# Patient Record
Sex: Female | Born: 1935 | Race: White | Hispanic: No | Smoking: Former smoker
Health system: Southern US, Community
[De-identification: ages and names within clinical notes are randomized; demographics above are authoritative.]

## PROBLEM LIST (undated history)

## (undated) DIAGNOSIS — R262 Difficulty in walking, not elsewhere classified: Secondary | ICD-10-CM

## (undated) DIAGNOSIS — R293 Abnormal posture: Secondary | ICD-10-CM

## (undated) DIAGNOSIS — M545 Low back pain, unspecified: Secondary | ICD-10-CM

## (undated) DIAGNOSIS — R279 Unspecified lack of coordination: Secondary | ICD-10-CM

## (undated) DIAGNOSIS — R42 Dizziness and giddiness: Secondary | ICD-10-CM

## (undated) DIAGNOSIS — B356 Tinea cruris: Secondary | ICD-10-CM

## (undated) DIAGNOSIS — M6281 Muscle weakness (generalized): Secondary | ICD-10-CM

## (undated) DIAGNOSIS — M129 Arthropathy, unspecified: Secondary | ICD-10-CM

## (undated) DIAGNOSIS — R489 Unspecified symbolic dysfunctions: Secondary | ICD-10-CM

## (undated) DIAGNOSIS — I10 Essential (primary) hypertension: Secondary | ICD-10-CM

---

## 2006-07-14 ENCOUNTER — Ambulatory Visit: Payer: Self-pay | Admitting: Orthopedic Surgery

## 2006-07-24 ENCOUNTER — Ambulatory Visit: Payer: Self-pay | Admitting: Orthopedic Surgery

## 2006-09-24 ENCOUNTER — Ambulatory Visit: Payer: Self-pay | Admitting: Orthopedic Surgery

## 2006-10-08 ENCOUNTER — Encounter: Admission: RE | Admit: 2006-10-08 | Discharge: 2006-10-08 | Payer: Self-pay | Admitting: Orthopedic Surgery

## 2006-10-29 ENCOUNTER — Encounter: Admission: RE | Admit: 2006-10-29 | Discharge: 2006-10-29 | Payer: Self-pay | Admitting: Orthopedic Surgery

## 2006-11-20 ENCOUNTER — Encounter: Admission: RE | Admit: 2006-11-20 | Discharge: 2006-11-20 | Payer: Self-pay | Admitting: Orthopedic Surgery

## 2006-12-26 DIAGNOSIS — Z8679 Personal history of other diseases of the circulatory system: Secondary | ICD-10-CM | POA: Insufficient documentation

## 2006-12-29 ENCOUNTER — Ambulatory Visit: Payer: Self-pay | Admitting: Orthopedic Surgery

## 2006-12-29 DIAGNOSIS — M1710 Unilateral primary osteoarthritis, unspecified knee: Secondary | ICD-10-CM

## 2006-12-29 DIAGNOSIS — M5137 Other intervertebral disc degeneration, lumbosacral region: Secondary | ICD-10-CM

## 2007-04-06 ENCOUNTER — Ambulatory Visit: Payer: Self-pay | Admitting: Orthopedic Surgery

## 2007-04-13 ENCOUNTER — Telehealth: Payer: Self-pay | Admitting: Orthopedic Surgery

## 2007-04-22 ENCOUNTER — Encounter: Payer: Self-pay | Admitting: Orthopedic Surgery

## 2007-05-25 ENCOUNTER — Encounter: Payer: Self-pay | Admitting: Orthopedic Surgery

## 2007-07-01 ENCOUNTER — Encounter: Payer: Self-pay | Admitting: Orthopedic Surgery

## 2007-10-19 ENCOUNTER — Telehealth: Payer: Self-pay | Admitting: Orthopedic Surgery

## 2007-11-02 ENCOUNTER — Ambulatory Visit: Payer: Self-pay | Admitting: Orthopedic Surgery

## 2007-11-02 DIAGNOSIS — M25469 Effusion, unspecified knee: Secondary | ICD-10-CM

## 2007-11-26 ENCOUNTER — Encounter: Payer: Self-pay | Admitting: Orthopedic Surgery

## 2007-12-17 ENCOUNTER — Encounter: Payer: Self-pay | Admitting: Orthopedic Surgery

## 2008-03-03 ENCOUNTER — Encounter: Payer: Self-pay | Admitting: Orthopedic Surgery

## 2008-06-02 ENCOUNTER — Encounter: Payer: Self-pay | Admitting: Orthopedic Surgery

## 2008-08-11 ENCOUNTER — Encounter: Payer: Self-pay | Admitting: Orthopedic Surgery

## 2009-05-09 ENCOUNTER — Encounter: Payer: Self-pay | Admitting: Internal Medicine

## 2009-05-10 ENCOUNTER — Ambulatory Visit (HOSPITAL_COMMUNITY): Admission: RE | Admit: 2009-05-10 | Discharge: 2009-05-10 | Payer: Self-pay | Admitting: Internal Medicine

## 2009-05-22 ENCOUNTER — Encounter: Payer: Self-pay | Admitting: Interventional Radiology

## 2009-05-30 ENCOUNTER — Ambulatory Visit: Payer: Self-pay | Admitting: Orthopedic Surgery

## 2010-02-26 ENCOUNTER — Telehealth: Payer: Self-pay | Admitting: Orthopedic Surgery

## 2010-04-08 ENCOUNTER — Encounter: Payer: Self-pay | Admitting: Interventional Radiology

## 2010-04-17 NOTE — Assessment & Plan Note (Signed)
Summary: BILAT KNEE PAIN/NEEDS XRAYS/MEDICARE,MUT OM/CAF   Visit Type:  Follow-up  CC:  bilateral knee pain.  History of Present Illness: 77 year old female status post T12 vertebroplasty in February 20 11th presents now for bilateral knee pain previously seen for this in the past given injections did well.  Complaint anterior knee pain which is moderate to severe, associated with weakness, swelling.  11/02/07 last visit here for knees received injections, injections helped.  Uses walker all the time.  Meds: Tylenol does not help, Ditropan, Metoprolol, Calcium, Lisinopril, Relafen two times a day.occasionally  Problems Prior to Update: 1)  Joint Effusion, Knee  (ICD-719.06) 2)  Disc Disease, Lumbosacral Spine  (ICD-722.52) 3)  Osteoarthritis, Lower Leg  (ICD-715.16) 4)  High Blood Pressure  (ICD-V12.50)  Allergies (verified): No Known Drug Allergies  Past History:  Past Medical History: Last updated: 12/26/2006 High Blood Pressure Reflux Arthritis Fibromyalgia  Past Surgical History: Last updated: 12/26/2006 Cholecystectomy Rotator cuff repair Hernia  Family History: Last updated: 12/29/2006 stroke heart   Review of Systems Musculoskeletal:  Complains of joint pain, swelling, and stiffness; denies instability, redness, heat, and muscle pain.  Physical Exam  Additional Exam:  Medium to large size shortened stature with no deformities, well groomed.  Normal pulses in the feet.  Skin intact normal RIGHT and LEFT knee.  Awake alert oriented x3 mood affect normal  Ambulates with a walker.  Inspection reveals no joint effusion antral medial joint line tenderness mild range of motion 120 near full extension in both knees strengths normal knees are stable meniscus signs are negative     Impression & Recommendations:  Problem # 1:  OSTEOARTHRITIS, LOWER LEG (ICD-715.16) Assessment Deteriorated  bilateral knee injections  X-rays both knees.  Again as noted  previously there only mild subtle changes of arthritis in these knees.  Her x-rays do not mattress symptoms.  She has a history of lumbar disc disease and I suspect that she is having some referred pain as well.  Again her x-rays look relatively normal for the age of 48.   Verbal consent was obtained. The knee was prepped with alcohol and ethyl chloride. 1 cc of depomedrol 40mg /cc and 4 cc of lidocaine 1% was injected. there were no complications.  Procedure repeated LEFT knee first on the RIGHT knee  Orders: Est. Patient Level IV (42353) Depo- Medrol 40mg  (J1030) Joint Aspirate / Injection, Large (20610)  Medications Added to Medication List This Visit: 1)  Nabumetone 500 Mg Tabs (Nabumetone) .Marland Kitchen.. 1 by mouth two times a day  Patient Instructions: 1)  You have received an injection of cortisone today. You may experience increased pain at the injection site. Apply ice pack to the area for 20 minutes every 2 hours and take 2 xtra strength tylenol every 8 hours. This increased pain will usually resolve in 24 hours. The injection will take effect in 3-10 days.  2)  f/u  as needed 3)  take relafen [nabumetone two times a day] Prescriptions: NABUMETONE 500 MG TABS (NABUMETONE) 1 by mouth two times a day  #60 x 2   Entered and Authorized by:   Fuller Canada MD   Signed by:   Fuller Canada MD on 05/30/2009   Method used:   Print then Give to Patient   RxID:   [id number]

## 2010-04-19 NOTE — Progress Notes (Signed)
Summary: call from patient request appointment for hip  Phone Note Call from Patient   Caller: Patient Summary of Call: Patient requests appointment for RT hip pain, believes it may be bursitis.  Has had back surgery @ [facility] although patient said "not her back".  Her primary care physician is Dr Sherril Croon, she states" has not seen him in a while."  Schedule here or other recommendation? Initial call taken by: Cammie Sickle,  February 26, 2010 2:17 PM  Follow-up for Phone Call        where is the pain   groin ? hip problem  anterior thigh ? - hip problem   side of thigh? hip problem   buttocks ? back problem  back ? back probem  Follow-up by: Fuller Canada MD,  February 26, 2010 2:36 PM  Additional Follow-up for Phone Call Additional follow up Details #1::        Advised patient, per re-review with Dr Romeo Apple, to see primary care (Dr Sherril Croon) for workup and referral here if Dr Sherril Croon wishes to refer. Additional Follow-up by: Cammie Sickle,  February 27, 2010 6:19 PM

## 2010-06-07 LAB — PROTIME-INR: INR: 0.98 (ref 0.00–1.49)

## 2010-06-07 LAB — BASIC METABOLIC PANEL
BUN: 18 mg/dL (ref 6–23)
CO2: 24 mEq/L (ref 19–32)
Calcium: 9.6 mg/dL (ref 8.4–10.5)
Chloride: 107 mEq/L (ref 96–112)
Creatinine, Ser: 0.83 mg/dL (ref 0.4–1.2)
GFR calc non Af Amer: >60 mL/min (ref >60)

## 2010-06-07 LAB — CBC
HCT: 41.7 % (ref 36.0–46.0)
MCHC: 34.2 g/dL (ref 30.0–36.0)
RBC: 4.65 MIL/uL (ref 3.87–5.11)

## 2012-09-23 ENCOUNTER — Encounter (HOSPITAL_COMMUNITY): Payer: Self-pay | Admitting: Emergency Medicine

## 2012-09-23 ENCOUNTER — Emergency Department (HOSPITAL_COMMUNITY)
Admission: EM | Admit: 2012-09-23 | Discharge: 2012-09-23 | Disposition: A | Discharge status: Home / Self Care | Payer: Medicare Other | Attending: Emergency Medicine | Admitting: Emergency Medicine

## 2012-09-23 ENCOUNTER — Emergency Department (HOSPITAL_COMMUNITY): Payer: Medicare Other

## 2012-09-23 DIAGNOSIS — R05 Cough: Secondary | ICD-10-CM | POA: Insufficient documentation

## 2012-09-23 DIAGNOSIS — M129 Arthropathy, unspecified: Secondary | ICD-10-CM | POA: Insufficient documentation

## 2012-09-23 DIAGNOSIS — R29810 Facial weakness: Secondary | ICD-10-CM | POA: Insufficient documentation

## 2012-09-23 DIAGNOSIS — F0390 Unspecified dementia without behavioral disturbance: Secondary | ICD-10-CM | POA: Insufficient documentation

## 2012-09-23 DIAGNOSIS — Z87891 Personal history of nicotine dependence: Secondary | ICD-10-CM | POA: Insufficient documentation

## 2012-09-23 DIAGNOSIS — Z8669 Personal history of other diseases of the nervous system and sense organs: Secondary | ICD-10-CM | POA: Insufficient documentation

## 2012-09-23 DIAGNOSIS — I10 Essential (primary) hypertension: Secondary | ICD-10-CM | POA: Insufficient documentation

## 2012-09-23 DIAGNOSIS — Z8619 Personal history of other infectious and parasitic diseases: Secondary | ICD-10-CM | POA: Insufficient documentation

## 2012-09-23 DIAGNOSIS — R5381 Other malaise: Secondary | ICD-10-CM | POA: Insufficient documentation

## 2012-09-23 DIAGNOSIS — R5383 Other fatigue: Secondary | ICD-10-CM | POA: Insufficient documentation

## 2012-09-23 DIAGNOSIS — Z8739 Personal history of other diseases of the musculoskeletal system and connective tissue: Secondary | ICD-10-CM | POA: Insufficient documentation

## 2012-09-23 DIAGNOSIS — R059 Cough, unspecified: Secondary | ICD-10-CM | POA: Insufficient documentation

## 2012-09-23 DIAGNOSIS — R0682 Tachypnea, not elsewhere classified: Secondary | ICD-10-CM | POA: Insufficient documentation

## 2012-09-23 DIAGNOSIS — R4182 Altered mental status, unspecified: Secondary | ICD-10-CM | POA: Insufficient documentation

## 2012-09-23 DIAGNOSIS — Z7982 Long term (current) use of aspirin: Secondary | ICD-10-CM | POA: Insufficient documentation

## 2012-09-23 DIAGNOSIS — Z79899 Other long term (current) drug therapy: Secondary | ICD-10-CM | POA: Insufficient documentation

## 2012-09-23 HISTORY — DX: Abnormal posture: R29.3

## 2012-09-23 HISTORY — DX: Arthropathy, unspecified: M12.9

## 2012-09-23 HISTORY — DX: Muscle weakness (generalized): M62.81

## 2012-09-23 HISTORY — DX: Essential (primary) hypertension: I10

## 2012-09-23 HISTORY — DX: Unspecified lack of coordination: R27.9

## 2012-09-23 HISTORY — DX: Tinea cruris: B35.6

## 2012-09-23 HISTORY — DX: Dizziness and giddiness: R42

## 2012-09-23 HISTORY — DX: Unspecified symbolic dysfunctions: R48.9

## 2012-09-23 HISTORY — DX: Low back pain, unspecified: M54.50

## 2012-09-23 HISTORY — DX: Difficulty in walking, not elsewhere classified: R26.2

## 2012-09-23 HISTORY — DX: Low back pain: M54.5

## 2012-09-23 LAB — CBC WITH DIFFERENTIAL/PLATELET
Basophils Absolute: 0 10*3/uL (ref 0.0–0.1)
Basophils Relative: 0 % (ref 0–1)
Eosinophils Absolute: 0 10*3/uL (ref 0.0–0.7)
Eosinophils Relative: 0 % (ref 0–5)
HCT: 36.2 % (ref 36.0–46.0)
Hemoglobin: 12.4 g/dL (ref 12.0–15.0)
MCHC: 34.3 g/dL (ref 30.0–36.0)
MCV: 88.1 fL (ref 78.0–100.0)
Monocytes Absolute: 1.7 10*3/uL — ABNORMAL HIGH (ref 0.1–1.0)
RDW: 13.1 % (ref 11.5–15.5)
WBC: 15.3 10*3/uL — ABNORMAL HIGH (ref 4.0–10.5)

## 2012-09-23 LAB — BASIC METABOLIC PANEL
BUN: 16 mg/dL (ref 6–23)
Calcium: 9.5 mg/dL (ref 8.4–10.5)
Chloride: 101 mEq/L (ref 96–112)
Glucose, Bld: 138 mg/dL — ABNORMAL HIGH (ref 70–99)
Sodium: 136 mEq/L (ref 135–145)

## 2012-09-23 NOTE — ED Provider Notes (Signed)
History  This chart was scribed for Donnetta Hutching, MD by Ardelia Mems, ED Scribe. This patient was seen in room APA05/[facility]05 and the patient's care was started at 8:33 PM.  CSN: [id number]  Arrival date & time 09/23/12  1957   Chief Complaint  Patient presents with  . Altered Mental Status  . Facial Droop    The history is provided by the nursing home. No language interpreter was used.     Level 5 Caveat (Dementia)  HPI Comments: Christina Bartlett is a 79 y.o. Female with a hx of HTN and generalized muscle weakness who presents to the Emergency Department complaining of altered mental status and left-sided facial droop. Pt is not a good historian and the hx is being provided by a nurse. All that the pt says is that "it all began as a sore throat 4-5 weeks ago. The pt's nurse from [facility] took over providing the pt's history and states that pt has had a left-sided facial droop, weakness and altered mental status noticed earlier today. Nurse states that pt is normally able to walk with assistance, but now is unable. Pt also has a non-productive cough and a UTI for which she is being treated for with antibiotics. Pt is a former smoker.   PCP- None   Past Medical History  Diagnosis Date  . Muscle weakness (generalized)   . Hypertension   . Symbolic dysfunction   . Arthropathy   . Lumbago   . Dermatophytosis of groin and perianal area   . Dizziness and giddiness   . Abnormal posture   . Difficulty in walking   . Lack of coordination    History reviewed. No pertinent past surgical history.  No family history on file.  History  Substance Use Topics  . Smoking status: Former Games developer  . Smokeless tobacco: Not on file  . Alcohol Use: No   OB History   Grav Para Term Preterm Abortions TAB SAB Ect Mult Living                 Review of Systems  Unable to perform ROS: Dementia   Allergies  Metoclopramide; Omeprazole; Pantoprazole; Penicillins; Prednisone; and Sulfa  antibiotics  Home Medications   Current Outpatient Rx  Name  Route  Sig  Dispense  Refill  . aspirin EC 81 MG tablet   Oral   Take 81 mg by mouth daily.         Marland Kitchen docusate sodium (COLACE) 100 MG capsule   Oral   Take 100 mg by mouth 2 (two) times daily.         Marland Kitchen HYDROcodone-acetaminophen (NORCO/VICODIN) 5-325 MG per tablet   Oral   Take 1 tablet by mouth every 4 (four) hours as needed for pain.         Marland Kitchen lisinopril (PRINIVIL,ZESTRIL) 10 MG tablet   Oral   Take 10-20 mg by mouth 2 (two) times daily. Takes 20 mg in the morning and 10 mg in the evening         . metaxalone (SKELAXIN) 800 MG tablet   Oral   Take 800 mg by mouth 3 (three) times daily as needed for pain.          Triage Vitals: BP 135/72  Pulse 97  Temp(Src) 98.7 F (37.1 C) (Oral)  Resp 18  SpO2 93%  Physical Exam  Nursing note and vitals reviewed. Constitutional: She is oriented to person, place, and time. She appears well-developed and well-nourished.  HENT:  Head: Normocephalic and atraumatic.  Eyes: Conjunctivae and EOM are normal. Pupils are equal, round, and reactive to light.  Neck: Normal range of motion. Neck supple.  Cardiovascular: Normal rate, regular rhythm and normal heart sounds.   Pulmonary/Chest: Effort normal and breath sounds normal.  Slightly tachypneic.  Abdominal: Soft. Bowel sounds are normal.  Musculoskeletal: Normal range of motion.  Neurological: She is alert and oriented to person, place, and time.  Able to move all her extremities but not vigorously.  Skin: Skin is warm and dry.  Psychiatric: She has a normal mood and affect.    ED Course  Procedures (including critical care time)  DIAGNOSTIC STUDIES: Oxygen Saturation is 93% on RA, normal by my interpretation.    COORDINATION OF CARE: 8:53 PM- Pt's nurse from [facility] advised of plan for diagnostic lab work and radiology and she agrees.   Labs Reviewed  CBC WITH DIFFERENTIAL - Abnormal; Notable for the  following:    WBC 15.3 (*)    Neutrophils Relative % 80 (*)    Neutro Abs 12.2 (*)    Lymphocytes Relative 9 (*)    Monocytes Absolute 1.7 (*)    All other components within normal limits  BASIC METABOLIC PANEL - Abnormal; Notable for the following:    Potassium 3.4 (*)    Glucose, Bld 138 (*)    GFR calc non Af Amer 82 (*)    All other components within normal limits  URINALYSIS, ROUTINE W REFLEX MICROSCOPIC   Ct Head Wo Contrast  09/23/2012   *RADIOLOGY REPORT*  Clinical Data: Altered mental status with left-sided facial droop and weakness.  Symptoms noted earlier today.  CT HEAD WITHOUT CONTRAST  Technique:  Contiguous axial images were obtained from the base of the skull through the vertex without contrast.  Comparison: None.  Findings: Technically limited study due to streak artifact and motion artifact in the skull base.  Diffuse cerebral atrophy. Ventricular dilatation likely due to central atrophy.  Low attenuation changes throughout the deep white matter consistent with small vessel ischemia.  No mass effect or midline shift.  No abnormal extra-axial fluid collections.  Gray-white matter junctions are distinct.  Basal cisterns are not effaced.  No evidence of acute intracranial hemorrhage.  No depressed skull fractures.  Visualized portions of the orbits and paranasal sinuses are not opacified.  IMPRESSION: No acute intracranial abnormalities demonstrated.  Prominent atrophy and small vessel ischemic changes.   Original Report Authenticated By: Burman Nieves, M.D.   Dg Chest Portable 1 View  09/23/2012   *RADIOLOGY REPORT*  Clinical Data: Altered mental status.  Facial droop tonight.  PORTABLE CHEST - 1 VIEW  Comparison: None.  Findings: Slightly shallow inspiration.  Heart size and pulmonary vascularity are normal.  There is diffuse interstitial change in the lungs which could represent chronic bronchitic change. Interstitial pneumonitis is not excluded.  Focal increased density in the  left midlung may represent superimposed pneumonia or atelectasis.  No blunting of costophrenic angles.  No pneumothorax. Calcified and tortuous aorta.  IMPRESSION: Diffuse interstitial changes suggesting fibrosis, interstitial pneumonitis, versus chronic bronchitic change.  Superimposed focal consolidation or atelectasis in the left mid lung.   Original Report Authenticated By: Burman Nieves, M.D.    No diagnosis found.  MDM  CT head shows no acute findings.  Chest x-ray shows diffuse interstitial changes but no pneumonia.  Patient is immobile nursing home. No reason to admit to hospital for acute care  I personally performed the services described in this documentation, which was scribed in my presence. The recorded information has been reviewed and is accurate.    Donnetta Hutching, MD 09/23/12 2320

## 2012-09-23 NOTE — ED Notes (Signed)
Pt is a resident of [facility].  Per staff at facility, pt has had some altered mental status and has a droop to the left side of her face since earlier today (unknown time)   Pt is awake, alert, mildly confused. Pt is apparently being treated for uti and also has a productive cough

## 2012-10-20 ENCOUNTER — Other Ambulatory Visit (HOSPITAL_COMMUNITY): Payer: Self-pay | Admitting: Internal Medicine

## 2012-10-20 DIAGNOSIS — M542 Cervicalgia: Secondary | ICD-10-CM

## 2012-10-20 DIAGNOSIS — M810 Age-related osteoporosis without current pathological fracture: Secondary | ICD-10-CM

## 2012-10-21 ENCOUNTER — Ambulatory Visit (HOSPITAL_COMMUNITY)
Admission: RE | Admit: 2012-10-21 | Discharge: 2012-10-21 | Disposition: A | Discharge status: Home / Self Care | Payer: Medicare Other | Source: Ambulatory Visit | Attending: Internal Medicine | Admitting: Internal Medicine

## 2012-10-21 ENCOUNTER — Other Ambulatory Visit (HOSPITAL_COMMUNITY): Payer: Self-pay | Admitting: Internal Medicine

## 2012-10-21 DIAGNOSIS — M546 Pain in thoracic spine: Secondary | ICD-10-CM | POA: Insufficient documentation

## 2012-10-21 DIAGNOSIS — M47812 Spondylosis without myelopathy or radiculopathy, cervical region: Secondary | ICD-10-CM | POA: Insufficient documentation

## 2012-10-21 DIAGNOSIS — M4804 Spinal stenosis, thoracic region: Secondary | ICD-10-CM | POA: Insufficient documentation

## 2012-10-21 DIAGNOSIS — M8448XA Pathological fracture, other site, initial encounter for fracture: Secondary | ICD-10-CM | POA: Insufficient documentation

## 2012-10-21 DIAGNOSIS — M542 Cervicalgia: Secondary | ICD-10-CM | POA: Insufficient documentation

## 2012-10-22 ENCOUNTER — Ambulatory Visit (HOSPITAL_COMMUNITY)
Admission: RE | Admit: 2012-10-22 | Discharge: 2012-10-22 | Disposition: A | Discharge status: Home / Self Care | Payer: Medicare Other | Source: Ambulatory Visit | Attending: Internal Medicine | Admitting: Internal Medicine

## 2012-10-22 DIAGNOSIS — M810 Age-related osteoporosis without current pathological fracture: Secondary | ICD-10-CM | POA: Insufficient documentation

## 2014-11-02 ENCOUNTER — Encounter (HOSPITAL_COMMUNITY): Payer: Self-pay

## 2014-11-02 ENCOUNTER — Emergency Department (HOSPITAL_COMMUNITY): Payer: Medicare Other

## 2014-11-02 ENCOUNTER — Emergency Department (HOSPITAL_COMMUNITY)
Admission: EM | Admit: 2014-11-02 | Discharge: 2014-11-03 | Disposition: A | Discharge status: Other Acute Inpatient Hospital | Payer: Medicare Other | Attending: Physician Assistant | Admitting: Physician Assistant

## 2014-11-02 DIAGNOSIS — S098XXA Other specified injuries of head, initial encounter: Secondary | ICD-10-CM | POA: Diagnosis present

## 2014-11-02 DIAGNOSIS — Y939 Activity, unspecified: Secondary | ICD-10-CM | POA: Insufficient documentation

## 2014-11-02 DIAGNOSIS — W050XXA Fall from non-moving wheelchair, initial encounter: Secondary | ICD-10-CM | POA: Diagnosis not present

## 2014-11-02 DIAGNOSIS — S12100A Unspecified displaced fracture of second cervical vertebra, initial encounter for closed fracture: Secondary | ICD-10-CM

## 2014-11-02 DIAGNOSIS — Y999 Unspecified external cause status: Secondary | ICD-10-CM | POA: Insufficient documentation

## 2014-11-02 DIAGNOSIS — S12112A Nondisplaced Type II dens fracture, initial encounter for closed fracture: Secondary | ICD-10-CM | POA: Diagnosis not present

## 2014-11-02 DIAGNOSIS — I10 Essential (primary) hypertension: Secondary | ICD-10-CM | POA: Insufficient documentation

## 2014-11-02 DIAGNOSIS — Y92129 Unspecified place in nursing home as the place of occurrence of the external cause: Secondary | ICD-10-CM | POA: Diagnosis not present

## 2014-11-02 DIAGNOSIS — W19XXXA Unspecified fall, initial encounter: Secondary | ICD-10-CM

## 2014-11-02 MED ORDER — ACETAMINOPHEN 500 MG PO TABS
1000.0000 mg | ORAL_TABLET | Freq: Once | ORAL | Status: AC
  Administered 2014-11-02: 1000 mg via ORAL
  Filled 2014-11-02: qty 2

## 2014-11-02 NOTE — ED Notes (Signed)
Pt states she fell out of her wheelchair onto her head. Pt has hematoma and abrasion to left side of forehead. Pt denies an LOC. Per EMS, pt was complaining of pain on the right side of her neck

## 2014-11-02 NOTE — ED Notes (Signed)
Pt. Reports falling from wheelchair. Pt. C/o back pain. Pt. With abrasion to forehead. Pt. Alert, no distress noted.

## 2014-11-02 NOTE — ED Provider Notes (Signed)
CSN: [id number]     Arrival date & time 11/02/14  1749 History   First MD Initiated Contact with Patient 11/02/14 1754     Chief Complaint  Patient presents with  . Fall     (Consider location/radiation/quality/duration/timing/severity/associated sxs/prior Treatment) Patient is a 81 y.o. female presenting with fall. The history is provided by the patient.  Fall This is a new problem. The current episode started less than 1 hour ago. The problem occurs constantly. The problem has not changed since onset.Pertinent negatives include no chest pain, no headaches and no shortness of breath. Nothing aggravates the symptoms. Nothing relieves the symptoms. She has tried nothing for the symptoms. The treatment provided no relief.   81 yo F with a chief complaint of a fall. Patient slid out of a wheelchair and landed on her head. Patient unsure of the risks of events. Patient with significant dementia making it difficult for history. Per the nursing home fell out of the wheelchair hit her head unknown loss of consciousness. Complaining of right-sided neck pain. Patient with flexed upper extremities.  lvl V caveat for dementia.     Past Medical History  Diagnosis Date  . Muscle weakness (generalized)   . Hypertension   . Symbolic dysfunction   . Arthropathy   . Lumbago   . Dermatophytosis of groin and perianal area   . Dizziness and giddiness   . Abnormal posture   . Difficulty in walking   . Lack of coordination    History reviewed. No pertinent past surgical history. No family history on file. Social History  Substance Use Topics  . Smoking status: Former Games developer  . Smokeless tobacco: None  . Alcohol Use: No   OB History    No data available     Review of Systems  Constitutional: Negative for fever and chills.  HENT: Negative for congestion and rhinorrhea.   Eyes: Negative for redness and visual disturbance.  Respiratory: Negative for shortness of breath and wheezing.    Cardiovascular: Negative for chest pain and palpitations.  Gastrointestinal: Negative for nausea and vomiting.  Genitourinary: Negative for dysuria and urgency.  Musculoskeletal: Positive for myalgias and arthralgias.  Skin: Negative for pallor and wound.  Neurological: Negative for dizziness and headaches.      Allergies  Metoclopramide; Omeprazole; Pantoprazole; Penicillins; Prednisone; and Sulfa antibiotics  Home Medications   Prior to Admission medications   Medication Sig Start Date End Date Taking? Authorizing Provider  acetaminophen (TYLENOL) 500 MG tablet Take 500 mg by mouth every 6 (six) hours as needed for mild pain or moderate pain.   Yes Historical Provider, MD  aspirin EC 81 MG tablet Take 81 mg by mouth daily.   Yes Historical Provider, MD  Camphor-Menthol-Methyl Sal (SALONPAS) 1.2-5.7-6.3 % PTCH Apply 1 patch topically daily. *Applied daily to back and neck then removed at bedtime   Yes Historical Provider, MD  Cranberry 450 MG CAPS Take 1 capsule by mouth daily.   Yes Historical Provider, MD  DULoxetine (CYMBALTA) 30 MG capsule Take 30 mg by mouth daily.   Yes Historical Provider, MD  lisinopril (PRINIVIL,ZESTRIL) 10 MG tablet Take 20 mg by mouth daily. Takes 20 mg in the morning and 10 mg in the evening   Yes Historical Provider, MD  polyethylene glycol powder (GLYCOLAX/MIRALAX) powder Take 17 g by mouth daily.   Yes Historical Provider, MD  potassium chloride SA (K-DUR,KLOR-CON) 20 MEQ tablet Take 20 mEq by mouth daily.   Yes Historical Provider, MD  BP 127/72 mmHg  Pulse 95  Temp(Src) 97.6 F (36.4 C) (Oral)  Resp 16  Ht 5\' 3"  (1.6 m)  Wt 135 lb (61.236 kg)  BMI 23.92 kg/m2  SpO2 97% Physical Exam  Constitutional: She is oriented to person, place, and time. She appears well-developed and well-nourished. No distress.  HENT:  Head: Normocephalic and atraumatic.  Mild abrasion to the forehead.  Eyes: EOM are normal. Pupils are equal, round, and reactive to  light.  Neck: Normal range of motion. Neck supple.  Cardiovascular: Normal rate and regular rhythm.  Exam reveals no gallop and no friction rub.   No murmur heard. Pulmonary/Chest: Effort normal. She has no wheezes. She has no rales.  Abdominal: Soft. She exhibits no distension. There is no tenderness. There is no rebound and no guarding.  Musculoskeletal: She exhibits tenderness (tender palpation about the mid and lower T-spine. Patient also with some right lateral tenderness to the C-spine.). She exhibits no edema.  Patient with upper extremity contractures. Unable to fully extend her upper extremities.  Neurological: She is alert and oriented to person, place, and time.  Skin: Skin is warm and dry. She is not diaphoretic.  Psychiatric: She has a normal mood and affect. Her behavior is normal.    ED Course  Procedures (including critical care time) Labs Review Labs Reviewed - No data to display  Imaging Review Dg Thoracic Spine 2 View  11/02/2014   CLINICAL DATA:  Status post fall today. Back pain. History of prior vertebral fracture.  EXAM: THORACIC SPINE 2 VIEWS  COMPARISON:  CT chest 10/21/2012.  FINDINGS: The patient is status post vertebral augmentation at T12 and L1 as seen on the prior study. Remote T10 superior endplate compression fracture is identified. Mild inferior endplate compression fracture of T2 seen on the prior examination is not well demonstrated today. There is a mild superior endplate compression fracture of T11 which is new since the prior examination but age indeterminate. Exaggeration of the normal thoracic kyphosis is noted. Aortic atherosclerosis is seen.  IMPRESSION: Mild superior endplate compression fracture of T11 is new since 2014 but age indeterminate. Remote T10, T12 and L1 fractures are noted.   Electronically Signed   By: Drusilla Kanner M.D.   On: 11/02/2014 19:29   Dg Lumbar Spine Complete  11/02/2014   CLINICAL DATA:  Trauma after falling out of her  wheelchair.  EXAM: LUMBAR SPINE - COMPLETE 4+ VIEW  COMPARISON:  CT scan of the chest dated 10/21/2012  FINDINGS: There are old compression fractures of T10, T12, and L1. Grade 1 spondylolisthesis of L4 on L5. There is no acute fracture. No disc space narrowing. Slight bilateral facet arthritis at L4-5 and L5-S1.  Large amount of stool in the rectum.  IMPRESSION: No acute abnormalities of the lumbar spine.   Electronically Signed   By: Francene Boyers M.D.   On: 11/02/2014 19:31   Ct Head Wo Contrast  11/02/2014   CLINICAL DATA:  Fall out of wheelchair today. Left forehead hematoma and abrasion. Right-sided neck pain. Initial encounter.  EXAM: CT HEAD WITHOUT CONTRAST  CT CERVICAL SPINE WITHOUT CONTRAST  TECHNIQUE: Multidetector CT imaging of the head and cervical spine was performed following the standard protocol without intravenous contrast. Multiplanar CT image reconstructions of the cervical spine were also generated.  COMPARISON:  10/21/2012 and 09/23/2012  FINDINGS: CT HEAD FINDINGS  There is no evidence of intracranial hemorrhage, brain edema, or other signs of acute infarction. There is no evidence of  intracranial mass lesion or mass effect. No abnormal extraaxial fluid collections are identified.  Diffuse cerebral and cerebellar atrophy and extensive chronic small vessel disease are stable in appearance. Old right thalamic lacunar infarct again noted. Ventricles stable in size. No evidence of skull fracture or pneumocephalus.  CT CERVICAL SPINE FINDINGS  A type 2 fracture is seen through the base of the odontoid process. There is approximately 3 mm posterior displacement of the odontoid process.  No other acute cervical spine fractures are identified. Mild multilevel degenerative disc disease and bilateral facet DJD noted.  IMPRESSION: No acute intracranial findings. Stable cerebral and cerebellar atrophy, and extensive chronic small vessel disease.  Type 2 fracture of C2 odontoid process, which is an  unstable fracture.  Critical Value/emergent results were called by telephone at the time of interpretation on 11/02/2014 at 7:22 pm to Dr. Adela Lank, who verbally acknowledged these results.   Electronically Signed   By: Myles Rosenthal M.D.   On: 11/02/2014 19:24   Ct Cervical Spine Wo Contrast  11/02/2014   CLINICAL DATA:  Fall out of wheelchair today. Left forehead hematoma and abrasion. Right-sided neck pain. Initial encounter.  EXAM: CT HEAD WITHOUT CONTRAST  CT CERVICAL SPINE WITHOUT CONTRAST  TECHNIQUE: Multidetector CT imaging of the head and cervical spine was performed following the standard protocol without intravenous contrast. Multiplanar CT image reconstructions of the cervical spine were also generated.  COMPARISON:  10/21/2012 and 09/23/2012  FINDINGS: CT HEAD FINDINGS  There is no evidence of intracranial hemorrhage, brain edema, or other signs of acute infarction. There is no evidence of intracranial mass lesion or mass effect. No abnormal extraaxial fluid collections are identified.  Diffuse cerebral and cerebellar atrophy and extensive chronic small vessel disease are stable in appearance. Old right thalamic lacunar infarct again noted. Ventricles stable in size. No evidence of skull fracture or pneumocephalus.  CT CERVICAL SPINE FINDINGS  A type 2 fracture is seen through the base of the odontoid process. There is approximately 3 mm posterior displacement of the odontoid process.  No other acute cervical spine fractures are identified. Mild multilevel degenerative disc disease and bilateral facet DJD noted.  IMPRESSION: No acute intracranial findings. Stable cerebral and cerebellar atrophy, and extensive chronic small vessel disease.  Type 2 fracture of C2 odontoid process, which is an unstable fracture.  Critical Value/emergent results were called by telephone at the time of interpretation on 11/02/2014 at 7:22 pm to Dr. Adela Lank, who verbally acknowledged these results.   Electronically Signed   By:  Myles Rosenthal M.D.   On: 11/02/2014 19:24   Ct Thoracic Spine Wo Contrast  11/02/2014   CLINICAL DATA:  Status post fall out of wheelchair and onto head. Right-sided neck pain. Concern for thoracic spine injury. Initial encounter.  EXAM: CT THORACIC SPINE WITHOUT CONTRAST  TECHNIQUE: Multidetector CT imaging of the thoracic spine was performed without intravenous contrast administration. Multiplanar CT image reconstructions were also generated.  COMPARISON:  Thoracic spine radiographs performed earlier today at 6:29 p.m., and CT of the thoracic spine performed 10/21/2012  FINDINGS: There is minimal chronic loss of height at T2, and partial osseous fusion at T5-T7. There is chronic loss of height at T10. The patient is status post vertebroplasty at T12 and L1, with underlying chronic loss of height.  Intervertebral disc spaces are grossly preserved. There is underlying chronic retropulsion at T12. The bony foramina are grossly unremarkable in appearance.  The visualized portions of the lungs are grossly clear, aside from minimal  scarring at the lung bases. No focal consolidation, pleural effusion or pneumothorax is seen. Mild emphysematous change is noted at the upper lung lobes.  Scattered calcification is noted along the thoracic aorta. Scattered coronary artery calcifications are seen. No mediastinal lymphadenopathy is seen. No pericardial effusion is identified.  Nonobstructing bilateral renal stones are seen, measuring up to 1.2 cm at the left renal hilum. Scattered calcification is noted along the abdominal aorta. There is mild atrophy of the paraspinal musculature along the mid to lower back.  IMPRESSION: 1. No evidence of acute fracture or subluxation along the thoracic spine. Minimal chronic loss of height at T2, slightly more prominent partial osseous fusion at T5-T7, and chronic loss of height at T10, T12 and L1, with changes of vertebroplasty again noted at T12 and L1. 2. Minimal scarring at the lung  bases. Mild emphysematous change at the left upper lung lobes. 3. Scattered coronary artery calcifications seen. 4. Nonobstructing bilateral renal stones, measuring up to 1.2 cm. 5. Scattered calcification along the thoracic and abdominal aorta. 6. Mild atrophy of the paraspinal musculature along the mid to lower back, relatively stable in appearance.   Electronically Signed   By: Roanna Raider M.D.   On: 11/02/2014 20:30   I have personally reviewed and evaluated these images and lab results as part of my medical decision-making.   EKG Interpretation None      MDM   Final diagnoses:  Odontoid fracture, closed, initial encounter    81 yo F with a chief complaint of right-sided neck pain. This happened after she fell out of a wheelchair. CT head C-spine T and L-spine plain films performed. Patient found to have a acute odontoid fracture. This was discussed with Dr. Bevely Palmer, from neurosurgery. Feel likely to have no nerve injury though questionable with patient's dementia. Recommend MRI to evaluate for possible nerve injury. No MRI available here will transfer to [facility]. Discussed with Dr. Verdie Mosher, will xsfer.  The patients results and plan were reviewed and discussed.   Any x-rays performed were independently reviewed by myself.   Differential diagnosis were considered with the presenting HPI.  Medications  acetaminophen (TYLENOL) tablet 1,000 mg (1,000 mg Oral Given 11/02/14 1931)    Filed Vitals:   11/02/14 1751 11/02/14 2002 11/02/14 2100 11/02/14 2115  BP: 138/80 131/69 127/72   Pulse: 89 85  95  Temp: 97.6 F (36.4 C)     TempSrc: Oral     Resp: 18 16    Height: 5\' 3"  (1.6 m)     Weight: 135 lb (61.236 kg)     SpO2: 100% 98%  97%    Final diagnoses:  Odontoid fracture, closed, initial encounter    Transfer were discussed with the admitting physician, patient and/or family and they are comfortable with the plan.      Melene Plan, DO 11/02/14 2158

## 2014-11-02 NOTE — ED Notes (Signed)
Pt. Transported to CT 

## 2014-11-02 NOTE — ED Notes (Signed)
Patient transported to MRI 

## 2014-11-02 NOTE — ED Notes (Signed)
Pt. Cervical collar changed to [employer]. C-spine maintained by Dr. Adela Lank.

## 2014-11-02 NOTE — ED Notes (Signed)
Pt at radiology  ?

## 2014-11-03 DIAGNOSIS — S12112A Nondisplaced Type II dens fracture, initial encounter for closed fracture: Secondary | ICD-10-CM | POA: Diagnosis not present

## 2014-11-03 MED ORDER — ONDANSETRON 4 MG PO TBDP
4.0000 mg | ORAL_TABLET | Freq: Once | ORAL | Status: AC
  Administered 2014-11-03: 4 mg via ORAL
  Filled 2014-11-03: qty 1

## 2014-11-03 MED ORDER — MORPHINE SULFATE (PF) 2 MG/ML IV SOLN
2.0000 mg | Freq: Once | INTRAVENOUS | Status: AC
  Administered 2014-11-03: 2 mg via INTRAMUSCULAR
  Filled 2014-11-03: qty 1

## 2014-11-03 NOTE — ED Notes (Signed)
Pt's son Onalee Hua [phone], daughter-in-law Elnita Maxwell [phone]

## 2014-11-03 NOTE — ED Provider Notes (Signed)
LATE ENTRY: Dr. Bevely Palmer reviewed the MRI cervical spine and clears the patient for discharge with a 2 week f/u in his clinic. Pt was sleeping comfortably when i saw her. She is noted to be moving all 4 with noxious stimuli.   Derwood Kaplan, MD 11/03/14 [phone]

## 2014-11-03 NOTE — Discharge Instructions (Signed)
You have a cervical spine fracture. It is your 2nd cervical spine that is fractures. Neurosurgery doctors have recommended that you keep the cervical collar on all the time and see them in 1 week. It is very important to keep the collar on at all the time.   Cervical Collar A cervical collar is used to hold the head and neck still. This may be a soft, thick collar or a more rigid hard collar. This can keep your sore neck muscles from hurting. The collar also protects you in case a more serious injury of the neck is present and can not be seen yet on an x-ray. FOLLOW THESE INSTRUCTIONS FOR COLLAR USE:  Attach the Velcro tab in back.  Make it tight enough to support part of the weight of your chin.  Do not make it so tight that it hurts or makes it hard to breathe.  Wear it until you are comfortable without it or as instructed. HOME CARE INSTRUCTIONS   Ice packs to the neck or areas of pain approximately 03-04 times a day for 15-20 minutes while awake. Do this for 2 days. Ask your physician if you may remove the cervical collar for bathing or ice.  Do not remove any collar unless instructed by a caregiver. Ask if the collar may be removed for showering or eating.  Only take over-the-counter or prescription medicines for pain, discomfort, or fever as directed by your caregiver.  Do not drive a car until given permission by your caregiver.  Follow all instructions for follow-up with your caregiver. This includes any referrals, physical therapy, and rehabilitation. Any delay in obtaining necessary care could result in a delay or failure of the injury to heal properly. SEEK IMMEDIATE MEDICAL CARE IF:   You develop increasing pain.  You develop problems using your arms or legs or have tingling or other funny feelings in them.  You develop loss of strength in either of your arms or legs or have difficulty walking.  You develop a fever or shaking chills.  You lose control of your bowels  or bladder. MAKE SURE YOU:   Understand these instructions.  Will watch your condition.  Will get help right away if you are not doing well or get worse. Document Released: 11/25/2003 Document Revised: 05/27/2011 Document Reviewed: 10/26/2007 [facility] Patient Information 2015 [place], [place]. This information is not intended to replace advice given to you by your health care provider. Make sure you discuss any questions you have with your health care provider. Cervical Spine Fracture, Untable A cervical spine fracture is a break or crack in one of the bones of the neck. A fracture is stable if the chances of it causing you problems while it is healing are very small. CAUSES   Vehicle accidents.  Injuries from sports such as diving, football, biking, wrestling, or skiing.  Occasionally, severe osteoporosis or other bone diseases, such as cancers that spread to bone or metabolic abnormalities. SYMPTOMS   Severe neck pain after an accident or fall.  Pain down your shoulders or arms.  Bruising or swelling on the back of your neck.  Numbness, tingling, muscle spasm, or weakness. DIAGNOSIS  Cervical spine fracture is diagnosed with the help of X-ray exams of your neck. Often a CT scan or MRI is used to confirm the diagnosis and help determine how your injury should be treated. Generally, an examination of your neck, arms, and legs, and the history of your injury prompts the health care provider to  order these tests.  TREATMENT  A stable fracture needs to be treated with a brace or cervical collar. A cervical collar is a two-piece collar designed to keep your neck from moving during the healing process. HOME CARE INSTRUCTIONS  Limit physical activity to prevent worsening of the fracture.  You may have been given a cervical collar to wear.  Do not remove the collar unless instructed by your health care provider.  If you have long hair, keep it outside of the collar.  Ask your health  care provider before making any adjustments to your collar. Minor adjustments may be required over time to improve comfort and reduce pressure on your chin or on the back of your head.  Keep your collar clean by wiping it with mild soap and water and drying it completely. The pads can be hand washed with soap and water and air dried completely.  If you are allowed to remove the collar for cleaning or bathing, follow your health care provider's instructions on how to do so safely.  If you are allowed to remove the collar for cleaning and bathing, wash and dry the skin of your neck. Check your skin for irritation or sores. If you see any, tell your health care provider.  Only take over-the-counter or prescription medicines for pain, discomfort, or fever as directed by your health care provider.   Keep all follow-up appointments as directed by your health care provider. Not keeping an appointment could result in a chronic or permanent injury, pain, and disability. Additionally, X-rays or an MRI may be repeated 1-3 weeks after your initial appointment. This is to:  Make sure any other breaks or cracks were not missed.   Help identify stretched or torn ligaments.   Get your test results if you did not get them when you were first evaluated. The results will determine whether you need other tests or treatment. It is your responsibility to get the results. SEEK MEDICAL CARE IF: You have irritation or sores on your skin from the cervical collar. SEEK IMMEDIATE MEDICAL CARE IF:   You have increasing pain in your neck.   You develop difficulties swallowing or breathing.  You develop swelling in your neck.   You have numbness, weakness, burning pain, or movement problems in the arms or legs.   You are unable to control your bowel or bladder (incontinence).   You have problems with coordination or difficulty walking. MAKE SURE YOU:   Understand these instructions.  Will watch your  condition.  Will get help right away if you are not doing well or get worse. Document Released: 01/20/2004 Document Revised: 03/09/2013 Document Reviewed: 09/28/2012 [facility] Patient Information 2015 [place], [place]. This information is not intended to replace advice given to you by your health care provider. Make sure you discuss any questions you have with your health care provider.

## 2014-11-03 NOTE — ED Notes (Signed)
Pt returned from MRI °

## 2014-11-03 NOTE — ED Notes (Signed)
MRI called to say that pt was really restless in MRI, requesting medication to help

## 2014-11-03 NOTE — ED Notes (Signed)
MD at bedside. 

## 2014-11-03 NOTE — ED Notes (Signed)
POC for patient is to be discharged back to [facility] in [place]. Spoke with Shermaine, LPN at this time to make them aware. Pt not ready for discharge at this time. Will call [facility] when ready for discharge back to facility.

## 2015-01-10 ENCOUNTER — Emergency Department (HOSPITAL_COMMUNITY): Payer: Medicare Other

## 2015-01-10 ENCOUNTER — Inpatient Hospital Stay (HOSPITAL_COMMUNITY)
Admission: EM | Admit: 2015-01-10 | Discharge: 2015-01-12 | DRG: 871 | Disposition: A | Discharge status: Hospice | Payer: Medicare Other | Attending: Internal Medicine | Admitting: Internal Medicine

## 2015-01-10 ENCOUNTER — Encounter (HOSPITAL_COMMUNITY): Payer: Self-pay

## 2015-01-10 DIAGNOSIS — R404 Transient alteration of awareness: Secondary | ICD-10-CM

## 2015-01-10 DIAGNOSIS — Z7189 Other specified counseling: Secondary | ICD-10-CM | POA: Diagnosis not present

## 2015-01-10 DIAGNOSIS — N179 Acute kidney failure, unspecified: Secondary | ICD-10-CM

## 2015-01-10 DIAGNOSIS — R6521 Severe sepsis with septic shock: Secondary | ICD-10-CM | POA: Diagnosis not present

## 2015-01-10 DIAGNOSIS — F0390 Unspecified dementia without behavioral disturbance: Secondary | ICD-10-CM

## 2015-01-10 DIAGNOSIS — Y95 Nosocomial condition: Secondary | ICD-10-CM | POA: Diagnosis present

## 2015-01-10 DIAGNOSIS — A419 Sepsis, unspecified organism: Principal | ICD-10-CM

## 2015-01-10 DIAGNOSIS — I959 Hypotension, unspecified: Secondary | ICD-10-CM

## 2015-01-10 DIAGNOSIS — R4182 Altered mental status, unspecified: Secondary | ICD-10-CM | POA: Diagnosis not present

## 2015-01-10 DIAGNOSIS — I10 Essential (primary) hypertension: Secondary | ICD-10-CM | POA: Diagnosis present

## 2015-01-10 DIAGNOSIS — R402423 Glasgow coma scale score 9-12, at hospital admission: Secondary | ICD-10-CM | POA: Diagnosis present

## 2015-01-10 DIAGNOSIS — J189 Pneumonia, unspecified organism: Secondary | ICD-10-CM | POA: Diagnosis not present

## 2015-01-10 DIAGNOSIS — E870 Hyperosmolality and hypernatremia: Secondary | ICD-10-CM | POA: Diagnosis present

## 2015-01-10 DIAGNOSIS — M129 Arthropathy, unspecified: Secondary | ICD-10-CM | POA: Diagnosis present

## 2015-01-10 DIAGNOSIS — E872 Acidosis: Secondary | ICD-10-CM | POA: Diagnosis present

## 2015-01-10 DIAGNOSIS — Z515 Encounter for palliative care: Secondary | ICD-10-CM | POA: Diagnosis not present

## 2015-01-10 DIAGNOSIS — Z87891 Personal history of nicotine dependence: Secondary | ICD-10-CM

## 2015-01-10 DIAGNOSIS — Z7982 Long term (current) use of aspirin: Secondary | ICD-10-CM

## 2015-01-10 DIAGNOSIS — Z66 Do not resuscitate: Secondary | ICD-10-CM | POA: Diagnosis present

## 2015-01-10 DIAGNOSIS — R197 Diarrhea, unspecified: Secondary | ICD-10-CM

## 2015-01-10 DIAGNOSIS — R4189 Other symptoms and signs involving cognitive functions and awareness: Secondary | ICD-10-CM

## 2015-01-10 LAB — I-STAT CHEM 8, ED
BUN: 60 mg/dL — ABNORMAL HIGH (ref 6–20)
CALCIUM ION: 1.33 mmol/L — AB (ref 1.13–1.30)
CHLORIDE: 124 mmol/L — AB (ref 101–111)
CREATININE: 2.4 mg/dL — AB (ref 0.44–1.00)
GLUCOSE: 146 mg/dL — AB (ref 65–99)
HCT: 38 % (ref 36.0–46.0)
Hemoglobin: 12.9 g/dL (ref 12.0–15.0)
POTASSIUM: 3.9 mmol/L (ref 3.5–5.1)
Sodium: 158 mmol/L — ABNORMAL HIGH (ref 135–145)
TCO2: 18 mmol/L (ref 0–100)

## 2015-01-10 LAB — COMPREHENSIVE METABOLIC PANEL
ALK PHOS: 67 U/L (ref 38–126)
ALT: 9 U/L — AB (ref 14–54)
AST: 18 U/L (ref 15–41)
Albumin: 2.7 g/dL — ABNORMAL LOW (ref 3.5–5.0)
Anion gap: 10 (ref 5–15)
BILIRUBIN TOTAL: 0.8 mg/dL (ref 0.3–1.2)
BUN: 77 mg/dL — ABNORMAL HIGH (ref 6–20)
CO2: 20 mmol/L — AB (ref 22–32)
Calcium: 9.7 mg/dL (ref 8.9–10.3)
Chloride: 127 mmol/L — ABNORMAL HIGH (ref 101–111)
Creatinine, Ser: 2.35 mg/dL — ABNORMAL HIGH (ref 0.44–1.00)
GFR, EST AFRICAN AMERICAN: 21 mL/min — AB (ref >60)
GFR, EST NON AFRICAN AMERICAN: 18 mL/min — AB (ref >60)
Glucose, Bld: 146 mg/dL — ABNORMAL HIGH (ref 65–99)
Potassium: 4 mmol/L (ref 3.5–5.1)
Sodium: 157 mmol/L — ABNORMAL HIGH (ref 135–145)
Total Protein: 5.7 g/dL — ABNORMAL LOW (ref 6.5–8.1)

## 2015-01-10 LAB — CBC WITH DIFFERENTIAL/PLATELET
BASOS ABS: 0 10*3/uL (ref 0.0–0.1)
BASOS PCT: 0 %
EOS PCT: 0 %
Eosinophils Absolute: 0 10*3/uL (ref 0.0–0.7)
HEMATOCRIT: 39.6 % (ref 36.0–46.0)
Hemoglobin: 12.4 g/dL (ref 12.0–15.0)
Lymphocytes Relative: 4 %
Lymphs Abs: 1 10*3/uL (ref 0.7–4.0)
MCH: 29.7 pg (ref 26.0–34.0)
MCHC: 31.3 g/dL (ref 30.0–36.0)
MCV: 94.7 fL (ref 78.0–100.0)
MONO ABS: 2 10*3/uL — AB (ref 0.1–1.0)
MONOS PCT: 8 %
NEUTROS ABS: 23.4 10*3/uL — AB (ref 1.7–7.7)
Neutrophils Relative %: 88 %
PLATELETS: 396 10*3/uL (ref 150–400)
RBC: 4.18 MIL/uL (ref 3.87–5.11)
RDW: 15 % (ref 11.5–15.5)
WBC: 26.5 10*3/uL — ABNORMAL HIGH (ref 4.0–10.5)

## 2015-01-10 LAB — BLOOD GAS, ARTERIAL
ACID-BASE EXCESS: 5.1 mmol/L — AB (ref 0.0–2.0)
BICARBONATE: 20.8 meq/L (ref 20.0–24.0)
FIO2: 100
O2 Saturation: 96.1 %
PATIENT TEMPERATURE: 37
PCO2 ART: 29.4 mmHg — AB (ref 35.0–45.0)
PO2 ART: 91.1 mmHg (ref 80.0–100.0)
TCO2: 16.1 mmol/L (ref 0–100)
pH, Arterial: 7.418 (ref 7.350–7.450)

## 2015-01-10 LAB — MRSA PCR SCREENING: MRSA by PCR: NEGATIVE

## 2015-01-10 LAB — C DIFFICILE QUICK SCREEN W PCR REFLEX
C DIFFICLE (CDIFF) ANTIGEN: NEGATIVE
C Diff interpretation: NEGATIVE
C Diff toxin: NEGATIVE

## 2015-01-10 LAB — TROPONIN I: Troponin I: 0.07 ng/mL — ABNORMAL HIGH (ref <0.031)

## 2015-01-10 LAB — I-STAT CG4 LACTIC ACID, ED: Lactic Acid, Venous: 3.33 mmol/L (ref 0.5–2.0)

## 2015-01-10 MED ORDER — LEVOFLOXACIN IN D5W 500 MG/100ML IV SOLN
500.0000 mg | Freq: Once | INTRAVENOUS | Status: AC
  Administered 2015-01-10: 500 mg via INTRAVENOUS
  Filled 2015-01-10: qty 100

## 2015-01-10 MED ORDER — CETYLPYRIDINIUM CHLORIDE 0.05 % MT LIQD
7.0000 mL | Freq: Two times a day (BID) | OROMUCOSAL | Status: DC
  Administered 2015-01-10: 7 mL via OROMUCOSAL

## 2015-01-10 MED ORDER — INFLUENZA VAC SPLIT QUAD 0.5 ML IM SUSY
0.5000 mL | PREFILLED_SYRINGE | INTRAMUSCULAR | Status: DC
  Filled 2015-01-10: qty 0.5

## 2015-01-10 MED ORDER — SODIUM CHLORIDE 0.9 % IV BOLUS (SEPSIS)
500.0000 mL | Freq: Once | INTRAVENOUS | Status: AC
  Administered 2015-01-10: 500 mL via INTRAVENOUS

## 2015-01-10 MED ORDER — VANCOMYCIN HCL IN DEXTROSE 1-5 GM/200ML-% IV SOLN
1000.0000 mg | Freq: Once | INTRAVENOUS | Status: AC
  Administered 2015-01-10: 1000 mg via INTRAVENOUS
  Filled 2015-01-10: qty 200

## 2015-01-10 MED ORDER — SODIUM CHLORIDE 0.9 % IJ SOLN: 3.0000 mL | INTRAMUSCULAR | Status: DC | PRN

## 2015-01-10 MED ORDER — SODIUM CHLORIDE 0.9 % IV SOLN: 250.0000 mL | INTRAVENOUS | Status: DC | PRN

## 2015-01-10 MED ORDER — CHLORHEXIDINE GLUCONATE 0.12 % MT SOLN
15.0000 mL | Freq: Two times a day (BID) | OROMUCOSAL | Status: DC
  Administered 2015-01-10: 15 mL via OROMUCOSAL
  Filled 2015-01-10: qty 15

## 2015-01-10 MED ORDER — MORPHINE SULFATE 25 MG/ML IV SOLN
INTRAVENOUS | Status: AC
  Filled 2015-01-10: qty 10

## 2015-01-10 MED ORDER — VANCOMYCIN HCL 500 MG IV SOLR
500.0000 mg | INTRAVENOUS | Status: DC
  Filled 2015-01-10: qty 500

## 2015-01-10 MED ORDER — LORAZEPAM 2 MG/ML IJ SOLN: 1.0000 mg | INTRAMUSCULAR | Status: DC | PRN

## 2015-01-10 MED ORDER — SODIUM CHLORIDE 0.9 % IV BOLUS (SEPSIS)
1000.0000 mL | Freq: Once | INTRAVENOUS | Status: AC
Start: 2015-01-10 — End: 2015-01-10
  Administered 2015-01-10: 1000 mL via INTRAVENOUS

## 2015-01-10 MED ORDER — SODIUM CHLORIDE 0.9 % IV SOLN
1.0000 mg/h | INTRAVENOUS | Status: DC
  Administered 2015-01-10: 1 mg/h via INTRAVENOUS
  Filled 2015-01-10: qty 10

## 2015-01-10 MED ORDER — PNEUMOCOCCAL VAC POLYVALENT 25 MCG/0.5ML IJ INJ
0.5000 mL | INJECTION | INTRAMUSCULAR | Status: DC
  Filled 2015-01-10: qty 0.5

## 2015-01-10 MED ORDER — SODIUM CHLORIDE 0.9 % IJ SOLN
3.0000 mL | Freq: Two times a day (BID) | INTRAMUSCULAR | Status: DC
  Administered 2015-01-11 – 2015-01-12 (×3): 3 mL via INTRAVENOUS

## 2015-01-10 NOTE — ED Notes (Signed)
Per EMS, pt here from [facility]. Pt was found unresponsive around 0930 this morning. Unknown if pt fell or injury.

## 2015-01-10 NOTE — Progress Notes (Signed)
PT IS NOW COMFORT CARE. MORPHINE DRIP STARTED AT 1MG /HR.FAMILY HAS GONE HOME BRIEFLY.

## 2015-01-10 NOTE — ED Notes (Signed)
Stage 2 with open area about 1-2 cm and redness noted to sacral area.  Allevyn Life Sacrum pad applied.  pericare done.  Moderate amount of dark tarry loose stool noted.  Family shown area to buttock (son and daughter in law).

## 2015-01-10 NOTE — ED Notes (Signed)
Attempted foley three times.  Unable to get foley inserted due to amount of feces that is constantly flowing from patient.

## 2015-01-10 NOTE — ED Notes (Signed)
Placed on Bipap 10/5 @ 100%

## 2015-01-10 NOTE — H&P (Signed)
[facility]          History and Physical    PCP:   Jani Gravel, MD   EDP: Tanna Furry, M.D.  Chief Complaint:  Unresponsiveness  HPI: Patient is an 81 year old woman from a skilled nursing facility who presents today after being found unresponsive. I am unable to obtain any history from the patient. ED records state that she was found unresponsive in her bed at the [place] home about 30 minutes prior to arrival. She does have a history of neck fractures and it is unknown if she fell or had an injury. Her blood pressure was found to be 198. EMS was unable to get a blood pressure. In the ED her blood pressure was 50/30. It has not drastically improved despite fluids coming up to about 70 or 80 systolic. She remains pretty much unresponsive even to sternal rub with occasional moaning, groaning and facial grimacing. Workup in the emergency department shows her to be hypernatremic with a sodium of 158, acute renal failure with a creatinine of 2.4, elevated lactic acid of 3.33, leukocytosis of 26.5. Chest x-ray shows small area of focal consolidation within the left lower lung. I have discussed case with son and daughter-in-law at bedside. Given her frail, debilitated state at baseline we have decided that putting her through a septic shock protocol will be what she would want. We have decided to make her comfort care and start her on a morphine drip.  Allergies:   Allergies  Allergen Reactions  . Metoclopramide   . Omeprazole   . Pantoprazole   . Penicillins   . Prednisone   . Sulfa Antibiotics       Past Medical History  Diagnosis Date  . Muscle weakness (generalized)   . Hypertension   . Symbolic dysfunction   . Arthropathy   . Lumbago   . Dermatophytosis of groin and perianal area   . Dizziness and giddiness   . Abnormal posture   . Difficulty in walking   . Lack of coordination     History reviewed. No pertinent past surgical history.  Prior  to Admission medications   Medication Sig Start Date End Date Taking? Authorizing Provider  acetaminophen (TYLENOL) 500 MG tablet Take 500 mg by mouth every 6 (six) hours as needed for mild pain or moderate pain.    Historical Provider, MD  aspirin EC 81 MG tablet Take 81 mg by mouth daily.    Historical Provider, MD  Camphor-Menthol-Methyl Sal (SALONPAS) 1.2-5.7-6.3 % PTCH Apply 1 patch topically daily. *Applied daily to back and neck then removed at bedtime    Historical Provider, MD  Cranberry 450 MG CAPS Take 1 capsule by mouth daily.    Historical Provider, MD  DULoxetine (CYMBALTA) 30 MG capsule Take 30 mg by mouth daily.    Historical Provider, MD  lisinopril (PRINIVIL,ZESTRIL) 10 MG tablet Take 20 mg by mouth daily. Takes 20 mg in the morning and 10 mg in the evening    Historical Provider, MD  polyethylene glycol powder (GLYCOLAX/MIRALAX) powder Take 17 g by mouth daily.    Historical Provider, MD  potassium chloride SA (K-DUR,KLOR-CON) 20 MEQ tablet Take 20 mEq by mouth daily.    Historical Provider, MD    Social History:  reports that she has quit smoking. She does not have any smokeless tobacco history on file. She reports that she does not drink alcohol or  use illicit drugs.  History reviewed. No pertinent family history.  Review of Systems:  Unable to obtain  Physical Exam: Blood pressure 76/46, pulse 34, temperature 101.2 F (38.4 C), temperature source Rectal, resp. rate 15, height 5' 2"  (1.575 m), weight 36.5 kg (80 lb 7.5 oz), SpO2 100 %. General: Unresponsive, cachectic Cardiovascular: Tachycardic, regular, systolic ejection murmur. Lungs: Fair air movement, no crackles or wheezes. HEENT: Normocephalic, atraumatic, very dry mucous membranes. Neck: Supple, JVD, lymphadenopathy, no bruits, no goiter Abdomen: Soft, nontender, nondistended, positive bowel sounds Neurologic: Unable to fully assess given current mental state.  Labs on Admission:  Results for orders placed  or performed during the hospital encounter of 01/10/15 (from the past 48 hour(s))  CBC with Differential/Platelet     Status: Abnormal   Collection Time: 01/10/15 10:52 AM  Result Value Ref Range   WBC 26.5 (H) 4.0 - 10.5 K/uL   RBC 4.18 3.87 - 5.11 MIL/uL   Hemoglobin 12.4 12.0 - 15.0 g/dL   HCT 39.6 36.0 - 46.0 %   MCV 94.7 78.0 - 100.0 fL   MCH 29.7 26.0 - 34.0 pg   MCHC 31.3 30.0 - 36.0 g/dL   RDW 15.0 11.5 - 15.5 %   Platelets 396 150 - 400 K/uL   Neutrophils Relative % 88 %   Neutro Abs 23.4 (H) 1.7 - 7.7 K/uL   Lymphocytes Relative 4 %   Lymphs Abs 1.0 0.7 - 4.0 K/uL   Monocytes Relative 8 %   Monocytes Absolute 2.0 (H) 0.1 - 1.0 K/uL   Eosinophils Relative 0 %   Eosinophils Absolute 0.0 0.0 - 0.7 K/uL   Basophils Relative 0 %   Basophils Absolute 0.0 0.0 - 0.1 K/uL  Comprehensive metabolic panel     Status: Abnormal   Collection Time: 01/10/15 10:52 AM  Result Value Ref Range   Sodium 157 (H) 135 - 145 mmol/L   Potassium 4.0 3.5 - 5.1 mmol/L   Chloride 127 (H) 101 - 111 mmol/L   CO2 20 (L) 22 - 32 mmol/L   Glucose, Bld 146 (H) 65 - 99 mg/dL   BUN 77 (H) 6 - 20 mg/dL   Creatinine, Ser 2.35 (H) 0.44 - 1.00 mg/dL   Calcium 9.7 8.9 - 10.3 mg/dL   Total Protein 5.7 (L) 6.5 - 8.1 g/dL   Albumin 2.7 (L) 3.5 - 5.0 g/dL   AST 18 15 - 41 U/L   ALT 9 (L) 14 - 54 U/L   Alkaline Phosphatase 67 38 - 126 U/L   Total Bilirubin 0.8 0.3 - 1.2 mg/dL   GFR calc non Af Amer 18 (L) >60 mL/min   GFR calc Af Amer 21 (L) >60 mL/min    Comment: (NOTE) The eGFR has been calculated using the CKD EPI equation. This calculation has not been validated in all clinical situations. eGFR's persistently <60 mL/min signify possible Chronic Kidney Disease.    Anion gap 10 5 - 15  Troponin I     Status: Abnormal   Collection Time: 01/10/15 10:52 AM  Result Value Ref Range   Troponin I 0.07 (H) <0.031 ng/mL    Comment:        PERSISTENTLY INCREASED TROPONIN VALUES IN THE RANGE OF  0.04-0.49 ng/mL CAN BE SEEN IN:       -UNSTABLE ANGINA       -CONGESTIVE HEART FAILURE       -MYOCARDITIS       -CHEST TRAUMA       -  ARRYHTHMIAS       -LATE PRESENTING MYOCARDIAL INFARCTION       -COPD   CLINICAL FOLLOW-UP RECOMMENDED.   Blood gas, arterial     Status: Abnormal   Collection Time: 01/10/15 10:55 AM  Result Value Ref Range   FIO2 100.00    Delivery systems OXYGEN MASK    pH, Arterial 7.418 7.350 - 7.450   pCO2 arterial 29.4 (L) 35.0 - 45.0 mmHg   pO2, Arterial 91.1 80.0 - 100.0 mmHg   Bicarbonate 20.8 20.0 - 24.0 mEq/L   TCO2 16.1 0 - 100 mmol/L   Acid-Base Excess 5.1 (H) 0.0 - 2.0 mmol/L   O2 Saturation 96.1 %   Patient temperature 37.0    Collection site BRACHIAL ARTERY    Drawn by COLLECTED BY RT    Sample type ARTERIAL    Allens test (pass/fail) NOT INDICATED (A) PASS  I-stat chem 8, ed     Status: Abnormal   Collection Time: 01/10/15 11:00 AM  Result Value Ref Range   Sodium 158 (H) 135 - 145 mmol/L   Potassium 3.9 3.5 - 5.1 mmol/L   Chloride 124 (H) 101 - 111 mmol/L   BUN 60 (H) 6 - 20 mg/dL   Creatinine, Ser 2.40 (H) 0.44 - 1.00 mg/dL   Glucose, Bld 146 (H) 65 - 99 mg/dL   Calcium, Ion 1.33 (H) 1.13 - 1.30 mmol/L   TCO2 18 0 - 100 mmol/L   Hemoglobin 12.9 12.0 - 15.0 g/dL   HCT 38.0 36.0 - 46.0 %  C difficile quick scan w PCR reflex     Status: None   Collection Time: 01/10/15 11:11 AM  Result Value Ref Range   C Diff antigen NEGATIVE NEGATIVE   C Diff toxin NEGATIVE NEGATIVE   C Diff interpretation Negative for toxigenic C. difficile   I-Stat CG4 Lactic Acid, ED     Status: Abnormal   Collection Time: 01/10/15 11:50 AM  Result Value Ref Range   Lactic Acid, Venous 3.33 (HH) 0.5 - 2.0 mmol/L   Comment NOTIFIED PHYSICIAN   Culture, blood (routine x 2)     Status: None (Preliminary result)   Collection Time: 01/10/15 12:38 PM  Result Value Ref Range   Specimen Description BLOOD LEFT ARM    Special Requests BOTTLES DRAWN AEROBIC ONLY 4CC     Culture PENDING    Report Status PENDING   MRSA PCR Screening     Status: None   Collection Time: 01/10/15  3:20 PM  Result Value Ref Range   MRSA by PCR NEGATIVE NEGATIVE    Comment:        The GeneXpert MRSA Assay (FDA approved for NASAL specimens only), is one component of a comprehensive MRSA colonization surveillance program. It is not intended to diagnose MRSA infection nor to guide or monitor treatment for MRSA infections.     Radiological Exams on Admission: Dg Chest 1 View  01/10/2015  CLINICAL DATA:  Patient with decreased level of consciousness. Unable to give history. EXAM: CHEST 1 VIEW COMPARISON:  Chest radiograph 11/02/2014 FINDINGS: Patient is kyphotic and rotated to the left, limiting evaluation. Stable cardiac and mediastinal contours. Interval development of focal consolidative opacity within the left mid lung. Stable biapical pleural parenchymal thickening. No definite pleural effusion or pneumothorax. Kyphoplasty material lower thoracic spine. Suggestion of irregularity of a few of the lower lateral left ribs. IMPRESSION: Small area focal consolidation within the left lower lung may represent an infectious process such as  pneumonia. Recommend short term radiographic followup to ensure resolution. Suggestion of irregularity of a few of the left lower lateral ribs which may be positional in etiology. Nondisplaced fractures are not excluded. Recommend correlation for clinical history and point tenderness. Electronically Signed   By: Lovey Newcomer M.D.   On: 01/10/2015 12:26   Ct Head Wo Contrast  01/10/2015  CLINICAL DATA:  Patient unresponsive.  No known traumatic injury. EXAM: CT HEAD WITHOUT CONTRAST TECHNIQUE: Contiguous axial images were obtained from the base of the skull through the vertex without intravenous contrast. COMPARISON:  Brain CT 11/02/2014 FINDINGS: Ventricles and sulci are prominent compatible with atrophy. Extensive periventricular and subcortical white  matter hypodensity compatible with chronic small vessel ischemic changes. Old bilateral basal ganglia lacunar infarcts. Old infarct within the right thalamus. No evidence for acute cortically based infarct, intracranial hemorrhage, mass lesion or mass effect. Orbits unremarkable. Limbic calcifications. Paranasal sinuses are well aerated. Mastoid air cells unremarkable. Calvarium is intact. Mild callus formation about the posterior arch of C1 fracture (image 2; series 3). Incompletely visualized known dens fracture. IMPRESSION: No acute intracranial process. Chronic small vessel ischemic changes. Old C1 and C2 fractures, incompletely visualized. Electronically Signed   By: Lovey Newcomer M.D.   On: 01/10/2015 12:39    Assessment/Plan Principal Problem:   Septic shock ([place]) Active Problems:   Hypotension   HCAP (healthcare-associated pneumonia)   Dementia    Septic shock -Source is presumably hospital-acquired pneumonia. -Given patient's frail and debilitated state and poor quality of life at baseline given her advanced dementia, after discussions with son and daughter-in-law we have decided to make her DO NOT RESUSCITATE and full comfort care. We have decided that the aggressiveness of putting her through a septic shock protocol is not something that she would have wanted. -Given she appears to be uncomfortable with moaning and facial grimacing, we'll initiate a morphine drip. Will also allow IV boluses of Ativan as needed. -I anticipate a hospital death.    Time Spent on Admission: 100 minutes  HERNANDEZ ACOSTA,Tashe Purdon [facility] Pager: [phone] 01/10/2015, 5:52 PM

## 2015-01-10 NOTE — ED Provider Notes (Addendum)
CSN: [id number]     Arrival date & time 01/10/15  1002 History  By signing my name below, I, Emmanuella Mensah, attest that this documentation has been prepared under the direction and in the presence of Rolland Porter, MD. Electronically Signed: Angelene Giovanni, ED Scribe. 01/10/2015. 12:58 PM.      Chief Complaint  Patient presents with  . Altered Mental Status   The history is provided by the EMS personnel. No language interpreter was used.   HPI Comments: Christina Bartlett is a 81 y.o. female brought in by ambulance, who presents to the Emergency Department complaining of AMS. Pt was found unresponsive in her bed at [facility] approx. 30 min PTA. Pt was last seen at her baseline at 9:30am and she is normally alert. Pt has a hx of neck fractures and is unknown if she fell or had an injury. Her blood sugar was 198 and tachycardiac as EMS was unable to get pressure.    Past Medical History  Diagnosis Date  . Muscle weakness (generalized)   . Hypertension   . Symbolic dysfunction   . Arthropathy   . Lumbago   . Dermatophytosis of groin and perianal area   . Dizziness and giddiness   . Abnormal posture   . Difficulty in walking   . Lack of coordination    No past surgical history on file. No family history on file. Social History  Substance Use Topics  . Smoking status: Former Games developer  . Smokeless tobacco: Not on file  . Alcohol Use: No   OB History    No data available     Review of Systems  Unable to perform ROS: Mental status change      Allergies  Metoclopramide; Omeprazole; Pantoprazole; Penicillins; Prednisone; and Sulfa antibiotics  Home Medications   Prior to Admission medications   Medication Sig Start Date End Date Taking? Authorizing Provider  acetaminophen (TYLENOL) 500 MG tablet Take 500 mg by mouth every 6 (six) hours as needed for mild pain or moderate pain.    Historical Provider, MD  aspirin EC 81 MG tablet Take 81 mg by mouth daily.     Historical Provider, MD  Camphor-Menthol-Methyl Sal (SALONPAS) 1.2-5.7-6.3 % PTCH Apply 1 patch topically daily. *Applied daily to back and neck then removed at bedtime    Historical Provider, MD  Cranberry 450 MG CAPS Take 1 capsule by mouth daily.    Historical Provider, MD  DULoxetine (CYMBALTA) 30 MG capsule Take 30 mg by mouth daily.    Historical Provider, MD  lisinopril (PRINIVIL,ZESTRIL) 10 MG tablet Take 20 mg by mouth daily. Takes 20 mg in the morning and 10 mg in the evening    Historical Provider, MD  polyethylene glycol powder (GLYCOLAX/MIRALAX) powder Take 17 g by mouth daily.    Historical Provider, MD  potassium chloride SA (K-DUR,KLOR-CON) 20 MEQ tablet Take 20 mEq by mouth daily.    Historical Provider, MD   BP 88/48 mmHg  Pulse 101  Temp(Src) 101.2 F (38.4 C) (Rectal)  Resp 24  SpO2 100% Physical Exam  Constitutional: She appears well-developed and well-nourished. No distress. Cervical collar in place.  C-collar in place Not verbally responsive but responsive to voice.   HENT:  Head: Normocephalic.  Eyes: Conjunctivae are normal. Pupils are equal, round, and reactive to light. No scleral icterus.  Neck: Normal range of motion. Neck supple. No thyromegaly present.  Cardiovascular: Regular rhythm.  Exam reveals no gallop and no friction rub.  No murmur heard. No radial pulse but topical femoral pulse HR: 130  Pulmonary/Chest: Effort normal. No respiratory distress. She has no wheezes. She has rales.  Diminished breath sounds at left back with crackles.   Abdominal: Soft. Bowel sounds are normal. She exhibits no distension. There is no tenderness. There is no rebound.  Musculoskeletal: Normal range of motion.  Neurological:  GCS 4+4+2  Skin: Skin is warm and dry. No rash noted.  Psychiatric: She has a normal mood and affect. Her behavior is normal.  Nursing note and vitals reviewed.   ED Course  Procedures (including critical care time) DIAGNOSTIC  STUDIES: Oxygen Saturation is 97% on RA, adequate by my interpretation.    COORDINATION OF CARE: 10:14 AM- Pt advised of plan for treatment and pt agrees.  10:20 AM - Speaking to Pt's family with update of pt's condition and plan of care.    Labs Review Labs Reviewed  CBC WITH DIFFERENTIAL/PLATELET - Abnormal; Notable for the following:    WBC 26.5 (*)    Neutro Abs 23.4 (*)    Monocytes Absolute 2.0 (*)    All other components within normal limits  BLOOD GAS, ARTERIAL - Abnormal; Notable for the following:    pCO2 arterial 29.4 (*)    Acid-Base Excess 5.1 (*)    Allens test (pass/fail) NOT INDICATED (*)    All other components within normal limits  COMPREHENSIVE METABOLIC PANEL - Abnormal; Notable for the following:    Sodium 157 (*)    Chloride 127 (*)    CO2 20 (*)    Glucose, Bld 146 (*)    BUN 77 (*)    Creatinine, Ser 2.35 (*)    Total Protein 5.7 (*)    Albumin 2.7 (*)    ALT 9 (*)    GFR calc non Af Amer 18 (*)    GFR calc Af Amer 21 (*)    All other components within normal limits  TROPONIN I - Abnormal; Notable for the following:    Troponin I 0.07 (*)    All other components within normal limits  I-STAT CHEM 8, ED - Abnormal; Notable for the following:    Sodium 158 (*)    Chloride 124 (*)    BUN 60 (*)    Creatinine, Ser 2.40 (*)    Glucose, Bld 146 (*)    Calcium, Ion 1.33 (*)    All other components within normal limits  I-STAT CG4 LACTIC ACID, ED - Abnormal; Notable for the following:    Lactic Acid, Venous 3.33 (*)    All other components within normal limits  C DIFFICILE QUICK SCREEN W PCR REFLEX  CULTURE, BLOOD (ROUTINE X 2)  CULTURE, BLOOD (ROUTINE X 2)  URINALYSIS, ROUTINE W REFLEX MICROSCOPIC (NOT AT [facility])  GI PATHOGEN PANEL BY PCR, STOOL  POC OCCULT BLOOD, ED    Imaging Review Dg Chest 1 View  01/10/2015  CLINICAL DATA:  Patient with decreased level of consciousness. Unable to give history. EXAM: CHEST 1 VIEW COMPARISON:  Chest  radiograph 11/02/2014 FINDINGS: Patient is kyphotic and rotated to the left, limiting evaluation. Stable cardiac and mediastinal contours. Interval development of focal consolidative opacity within the left mid lung. Stable biapical pleural parenchymal thickening. No definite pleural effusion or pneumothorax. Kyphoplasty material lower thoracic spine. Suggestion of irregularity of a few of the lower lateral left ribs. IMPRESSION: Small area focal consolidation within the left lower lung may represent an infectious process such as pneumonia. Recommend short term  radiographic followup to ensure resolution. Suggestion of irregularity of a few of the left lower lateral ribs which may be positional in etiology. Nondisplaced fractures are not excluded. Recommend correlation for clinical history and point tenderness. Electronically Signed   By: Annia Beltrew  Davis M.D.   On: 01/10/2015 12:26   Ct Head Wo Contrast  01/10/2015  CLINICAL DATA:  Patient unresponsive.  No known traumatic injury. EXAM: CT HEAD WITHOUT CONTRAST TECHNIQUE: Contiguous axial images were obtained from the base of the skull through the vertex without intravenous contrast. COMPARISON:  Brain CT 11/02/2014 FINDINGS: Ventricles and sulci are prominent compatible with atrophy. Extensive periventricular and subcortical white matter hypodensity compatible with chronic small vessel ischemic changes. Old bilateral basal ganglia lacunar infarcts. Old infarct within the right thalamus. No evidence for acute cortically based infarct, intracranial hemorrhage, mass lesion or mass effect. Orbits unremarkable. Limbic calcifications. Paranasal sinuses are well aerated. Mastoid air cells unremarkable. Calvarium is intact. Mild callus formation about the posterior arch of C1 fracture (image 2; series 3). Incompletely visualized known dens fracture. IMPRESSION: No acute intracranial process. Chronic small vessel ischemic changes. Old C1 and C2 fractures, incompletely  visualized. Electronically Signed   By: Annia Beltrew  Davis M.D.   On: 01/10/2015 12:39   Rolland PorterMark Glora Hulgan, MD has personally reviewed and evaluated these images and lab results as part of his medical decision-making.   EKG Interpretation None      MDM   Final diagnoses:  Hypotension, unspecified hypotension type  Sepsis, due to unspecified organism (HCC)  Diarrhea, unspecified type  Acute kidney injury (HCC)    Unable to obtain urine sample despite multiple attempts. Has continuous diarrhea. Sample obtained for studies. Labs show acute kidney injury, hyper knee treatment, lactic acidosis, leukocytosis.  Has diminished and rhonchorous left lower lobe breath sounds on exam. X-ray unimpressive but with small area of consolidation. CT of the head shows no acute abnormalities noted.  Patient given fluid boluses of 2500 mL. Blood pressure improved to 110. Weaned from BiPAP to nasal cannula. At this point does not currently need pressors. Central access would be limited likely due to subclavian or pick line. As patient with regular diarrhea, which I feel would preclude sterile placement of femoral line. And a cervical collar and didn't fracture precluding manipulation of neck for IJ. Patient will undergo PICC line. I placed a call hospitalist to discuss disposition.   CRITICAL CARE Performed by: Rolland PorterJAMES, Sophee Mckimmy JOSEPH   Total critical care time: 60 minutes including multiple fluid boluses, management of BiPAP  Critical care time was exclusive of separately billable procedures and treating other patients.  Critical care was necessary to treat or prevent imminent or life-threatening deterioration.  Critical care was time spent personally by me on the following activities: development of treatment plan with patient and/or surrogate as well as nursing, discussions with consultants, evaluation of patient's response to treatment, examination of patient, obtaining history from patient or surrogate, ordering and  performing treatments and interventions, ordering and review of laboratory studies, ordering and review of radiographic studies, pulse oximetry and re-evaluation of patient's condition. Care   Rolland PorterMark Safiya Girdler, MD 01/10/15 1301  Rolland PorterMark Ileigh Mettler, MD 01/10/15 [phone]

## 2015-01-10 NOTE — ED Notes (Signed)
Dr Fayrene FearingJames informed that there has been 3 attempts without success to insert foley or obtain urine sample.  Pt is constantly stool ing foul smelling watery black bowel contents.  Pt does resist when foley care and insertion attempted.  Stool samples sent to lab.

## 2015-01-10 NOTE — Consult Note (Signed)
Consultation Note Date: 01/10/2015   Patient Name: Christina Bartlett  DOB: 11/09/1930  MRN: [id number]  Age / Sex: 81 y.o., female   PCP: Pearson GrippeJames Kim, MD Referring Physician: Micael HampshireEstela Y Hernandez Acost*  Reason for Consultation: Establishing goals of care and Psychosocial/spiritual support  Palliative Care Assessment and Plan Summary of Established Goals of Care and Medical Treatment Preferences   Clinical Assessment/Narrative:   Chronic illness trajectory regarding dementia reviewed, family states this shows Christina Bartlett over the last few years. Family state they want to continue to have all support outside of intubation. I ask if Christina Bartlett were sitting here with us now, as she was 8 years ago, and could see herself, what would she say to do.  Both son, Onalee HuaDavid and DIL Elnita MaxwellCheryl state that she would want everything done.  The tell me that she has lived at [facility] for the last 2.5 years and they "just found out that she has a bedsore".  They also tell me that until this illness she was able to feed herself. They also talk about the loss of her twin brother and a sister in the last year.  They haven't told her of this yet. We discuss the concepts of do not treat the next infection and do not re-hospitalize, allow a natural death.    Contacts/Participants in Discussion: Primary Decision Maker: Son, Onalee HuaDavid with his wife Elnita MaxwellCheryl HCPOA: no   Code Status/Advance Care Planning:  FULL code  NO intubation,   YES CPR, defibrillation, vasopressors, trial of PEG tube.    Psycho-social/Spiritual:   Support System: Lives at [facility] for 2.5 years  Desire for further Chaplaincy support:  Not at this time.   Prognosis: < 6 months  Discharge Planning:  24 -48 hour trial before dispositon.        Chief Complaint: Unresponsiveness History of Present Illness: Patient is an 81 year old woman from a skilled nursing facility who presents today after being found unresponsive. I am unable to obtain any history  from the patient. ED records state that she was found unresponsive in her bed at the [facility] home about 30 minutes prior to arrival. She does have a history of neck fractures and it is unknown if she fell or had an injury. Her blood pressure was found to be 198. EMS was unable to get a blood pressure. In the ED her blood pressure was 50/30. It has not drastically improved despite fluids coming up to about 70 or 80 systolic. She remains pretty much unresponsive even to sternal rub with occasional moaning, groaning and facial grimacing. Workup in the emergency department shows her to be hypernatremic with a sodium of 158, acute renal failure with a creatinine of 2.4, elevated lactic acid of 3.33, leukocytosis of 26.5. Chest x-ray shows small area of focal consolidation within the left lower lung. I have discussed case with son and daughter-in-law at bedside.   Primary Diagnoses  Present on Admission:  . Hypotension  Palliative Review of Systems: Christina Bartlett is unable to participate in ROS at this time.  I have reviewed the medical record, interviewed the patient and family, and examined the patient. The following aspects are pertinent.  Past Medical History  Diagnosis Date  . Muscle weakness (generalized)   . Hypertension   . Symbolic dysfunction   . Arthropathy   . Lumbago   . Dermatophytosis of groin and perianal area   . Dizziness and giddiness   . Abnormal posture   . Difficulty in walking   .  Lack of coordination    Social History   Social History  . Marital Status: Widowed    Spouse Name: N/A  . Number of Children: N/A  . Years of Education: N/A   Social History Main Topics  . Smoking status: Former Games developer  . Smokeless tobacco: None  . Alcohol Use: No  . Drug Use: No  . Sexual Activity: Not Asked   Other Topics Concern  . None   Social History Narrative   History reviewed. No pertinent family history. Scheduled Meds: . antiseptic oral rinse  7 mL Mouth Rinse  [id number]  . chlorhexidine  15 mL Mouth Rinse BID  . [START ON 01/11/2015] Influenza vac split quadrivalent PF  0.5 mL Intramuscular Tomorrow-1000  . [START ON 01/11/2015] pneumococcal 23 valent vaccine  0.5 mL Intramuscular Tomorrow-1000  . [START ON 01/11/2015] vancomycin  500 mg Intravenous Q24H   Continuous Infusions:  PRN Meds:. Medications Prior to Admission:  Prior to Admission medications   Medication Sig Start Date End Date Taking? Authorizing Provider  acetaminophen (TYLENOL) 500 MG tablet Take 500 mg by mouth every 6 (six) hours as needed for mild pain or moderate pain.    Historical Provider, MD  aspirin EC 81 MG tablet Take 81 mg by mouth daily.    Historical Provider, MD  Camphor-Menthol-Methyl Sal (SALONPAS) 1.2-5.7-6.3 % PTCH Apply 1 patch topically daily. *Applied daily to back and neck then removed at bedtime    Historical Provider, MD  Cranberry 450 MG CAPS Take 1 capsule by mouth daily.    Historical Provider, MD  DULoxetine (CYMBALTA) 30 MG capsule Take 30 mg by mouth daily.    Historical Provider, MD  lisinopril (PRINIVIL,ZESTRIL) 10 MG tablet Take 20 mg by mouth daily. Takes 20 mg in the morning and 10 mg in the evening    Historical Provider, MD  polyethylene glycol powder (GLYCOLAX/MIRALAX) powder Take 17 g by mouth daily.    Historical Provider, MD  potassium chloride SA (K-DUR,KLOR-CON) 20 MEQ tablet Take 20 mEq by mouth daily.    Historical Provider, MD   Allergies  Allergen Reactions  . Metoclopramide   . Omeprazole   . Pantoprazole   . Penicillins   . Prednisone   . Sulfa Antibiotics    CBC:    Component Value Date/Time   WBC 26.5* 01/10/2015 1052   HGB 12.9 01/10/2015 1100   HCT 38.0 01/10/2015 1100   PLT 396 01/10/2015 1052   MCV 94.7 01/10/2015 1052   NEUTROABS 23.4* 01/10/2015 1052   LYMPHSABS 1.0 01/10/2015 1052   MONOABS 2.0* 01/10/2015 1052   EOSABS 0.0 01/10/2015 1052   BASOSABS 0.0 01/10/2015 1052   Comprehensive Metabolic Panel:      Component Value Date/Time   NA 158* 01/10/2015 1100   K 3.9 01/10/2015 1100   CL 124* 01/10/2015 1100   CO2 20* 01/10/2015 1052   BUN 60* 01/10/2015 1100   CREATININE 2.40* 01/10/2015 1100   GLUCOSE 146* 01/10/2015 1100   CALCIUM 9.7 01/10/2015 1052   AST 18 01/10/2015 1052   ALT 9* 01/10/2015 1052   ALKPHOS 67 01/10/2015 1052   BILITOT 0.8 01/10/2015 1052   PROT 5.7* 01/10/2015 1052   ALBUMIN 2.7* 01/10/2015 1052    Physical Exam: Vital Signs: BP 76/46 mmHg  Pulse 34  Temp(Src) 101.2 F (38.4 C) (Rectal)  Resp 15  Ht  (1.575 m)  Wt 36.5 kg (80 lb 7.5 oz)  BMI 14.71 kg/m2  SpO2 100% SpO2: SpO2: 100 %  O2 Device: O2 Device: NRB O2 Flow Rate: O2 Flow Rate (L/min): 12 L/min Intake/output summary:  Intake/Output Summary (Last 24 hours) at 01/10/15 1716 Last data filed at 01/10/15 1335  Gross per 24 hour  Intake   2500 ml  Output      0 ml  Net   2500 ml   LBM: Last BM Date: 01/10/15 Baseline Weight: Weight: 54.432 kg (120 lb) Most recent weight: Weight: 36.5 kg (80 lb 7.5 oz)  Exam Findings:  Constitutional: frail, chronically ill appearing Resp:  Shallow          Palliative Performance Scale: 20%              Additional Data Reviewed: Recent Labs     01/10/15  1052  01/10/15  1100  WBC  26.5*   --   HGB  12.4  12.9  PLT  396   --   NA  157*  158*  BUN  77*  60*  CREATININE  2.35*  2.40*     Time In: 1530 Time Out: 1630 Time Total:  60 minutes Greater than 50%  of this time was spent counseling and coordinating care related to the above assessment and plan.  Signed by: Katheran Awe, NP  Katheran Awe, NP  01/10/2015, 5:16 PM  Please contact Palliative Medicine Team phone at [phone] for questions and concerns.

## 2015-01-10 NOTE — ED Notes (Signed)
Resp called to transport pt on bipap to CT scan.

## 2015-01-10 NOTE — Progress Notes (Signed)
ANTIBIOTIC CONSULT NOTE - INITIAL  Pharmacy Consult for Vancomycin Indication: pneumonia  Allergies  Allergen Reactions  . Metoclopramide   . Omeprazole   . Pantoprazole   . Penicillins   . Prednisone   . Sulfa Antibiotics    Patient Measurements: Height: 5\' 2"  (157.5 cm) Weight: 120 lb (54.432 kg) IBW/kg (Calculated) : 50.1  Vital Signs: Temp: 101.2 F (38.4 C) (10/25 1034) Temp Source: Rectal (10/25 1034) BP: 118/69 mmHg (10/25 1303) Pulse Rate: 85 (10/25 1303) Intake/Output from previous day:   Intake/Output from this shift: Total I/O In: 2500 [I.V.:2500] Out: -   Labs:  Recent Labs  01/10/15 1052 01/10/15 1100  WBC 26.5*  --   HGB 12.4 12.9  PLT 396  --   CREATININE 2.35* 2.40*   Estimated Creatinine Clearance: 14.3 mL/min (by C-G formula based on Cr of 2.4). No results for input(s): VANCOTROUGH, VANCOPEAK, VANCORANDOM, GENTTROUGH, GENTPEAK, GENTRANDOM, TOBRATROUGH, TOBRAPEAK, TOBRARND, AMIKACINPEAK, AMIKACINTROU, AMIKACIN in the last 72 hours.   Microbiology: Recent Results (from the past 720 hour(s))  C difficile quick scan w PCR reflex     Status: None   Collection Time: 01/10/15 11:11 AM  Result Value Ref Range Status   C Diff antigen NEGATIVE NEGATIVE Final   C Diff toxin NEGATIVE NEGATIVE Final   C Diff interpretation Negative for toxigenic C. difficile  Final  Culture, blood (routine x 2)     Status: None (Preliminary result)   Collection Time: 01/10/15 12:38 PM  Result Value Ref Range Status   Specimen Description BLOOD LEFT ARM  Final   Special Requests BOTTLES DRAWN AEROBIC ONLY 4CC  Final   Culture PENDING  Incomplete   Report Status PENDING  Incomplete   Medical History: Past Medical History  Diagnosis Date  . Muscle weakness (generalized)   . Hypertension   . Symbolic dysfunction   . Arthropathy   . Lumbago   . Dermatophytosis of groin and perianal area   . Dizziness and giddiness   . Abnormal posture   . Difficulty in walking    . Lack of coordination    Anti-infectives    Start     Dose/Rate Route Frequency Ordered Stop   01/10/15 1215  vancomycin (VANCOCIN) IVPB 1000 mg/200 mL premix     1,000 mg 200 mL/hr over 60 Minutes Intravenous  Once 01/10/15 1211 01/10/15 1352   01/10/15 1200  levofloxacin (LEVAQUIN) IVPB 500 mg     500 mg 100 mL/hr over 60 Minutes Intravenous  Once 01/10/15 1159 01/10/15 1402     Assessment: 81yo female with elevated SCr.  Estimated Creatinine Clearance: 14.3 mL/min (by C-G formula based on Cr of 2.4).    Asked to initiate Vancomycin for suspected pna.    Goal of Therapy:  Vancomycin trough level 15-20 mcg/ml  Plan:  Vancomycin 1000mg  IV today x 1 then Vancomycin 500mg  IV q24hrs starting tomorrow Check trough at steady state Monitor labs, renal fxn, and c/s  Valrie HartHall, Charleigh Correnti A 01/10/2015,2:02 PM

## 2015-01-10 NOTE — ED Notes (Signed)
Heels red bilaterally.  Old pressure sore noted to sacral area, stage 2, healing site noted.  pericare done

## 2015-01-11 DIAGNOSIS — Z7982 Long term (current) use of aspirin: Secondary | ICD-10-CM | POA: Diagnosis not present

## 2015-01-11 DIAGNOSIS — E872 Acidosis: Secondary | ICD-10-CM | POA: Diagnosis present

## 2015-01-11 DIAGNOSIS — Z87891 Personal history of nicotine dependence: Secondary | ICD-10-CM | POA: Diagnosis not present

## 2015-01-11 DIAGNOSIS — F0390 Unspecified dementia without behavioral disturbance: Secondary | ICD-10-CM | POA: Diagnosis present

## 2015-01-11 DIAGNOSIS — R6521 Severe sepsis with septic shock: Secondary | ICD-10-CM

## 2015-01-11 DIAGNOSIS — Z7189 Other specified counseling: Secondary | ICD-10-CM | POA: Diagnosis not present

## 2015-01-11 DIAGNOSIS — N179 Acute kidney failure, unspecified: Secondary | ICD-10-CM | POA: Diagnosis present

## 2015-01-11 DIAGNOSIS — Z515 Encounter for palliative care: Secondary | ICD-10-CM | POA: Insufficient documentation

## 2015-01-11 DIAGNOSIS — R402423 Glasgow coma scale score 9-12, at hospital admission: Secondary | ICD-10-CM | POA: Diagnosis present

## 2015-01-11 DIAGNOSIS — Z66 Do not resuscitate: Secondary | ICD-10-CM | POA: Diagnosis present

## 2015-01-11 DIAGNOSIS — J189 Pneumonia, unspecified organism: Secondary | ICD-10-CM

## 2015-01-11 DIAGNOSIS — A419 Sepsis, unspecified organism: Principal | ICD-10-CM

## 2015-01-11 DIAGNOSIS — E870 Hyperosmolality and hypernatremia: Secondary | ICD-10-CM | POA: Diagnosis present

## 2015-01-11 DIAGNOSIS — R4182 Altered mental status, unspecified: Secondary | ICD-10-CM | POA: Diagnosis present

## 2015-01-11 DIAGNOSIS — M129 Arthropathy, unspecified: Secondary | ICD-10-CM | POA: Diagnosis present

## 2015-01-11 DIAGNOSIS — I10 Essential (primary) hypertension: Secondary | ICD-10-CM | POA: Diagnosis present

## 2015-01-11 DIAGNOSIS — Y95 Nosocomial condition: Secondary | ICD-10-CM | POA: Diagnosis present

## 2015-01-11 MED ORDER — ATROPINE SULFATE 1 % OP SOLN
1.0000 [drp] | Freq: Four times a day (QID) | OPHTHALMIC | Status: DC | PRN
  Administered 2015-01-11: 1 [drp] via SUBLINGUAL
  Filled 2015-01-11: qty 2

## 2015-01-11 NOTE — Progress Notes (Signed)
Daily Progress Note   Patient Name: Christina Bartlett       Date: 01/11/2015 DOB: May 20, 1930  Age: 81 y.o. MRN#: [id number] Attending Physician: Orvan Falconer, MD Primary Care Physician: Jani Gravel, MD Admit Date: 01/10/2015  Reason for Consultation/Follow-up: Establishing goals of care and Psychosocial/spiritual support  Subjective: Brief meeting with son and DIL this am.  They tell me that they have decided to persue comfort care only.  That they are expecting Christina Bartlett to transfer to 300, remove her IV and give her pain medication. Son Shanon Brow tells me that he has peace about this.  Christina Bartlett is able to open her eyes and look at me, but provide no meaningful communication.  She appears to be pain free. Dr. Jerilee Hoh met with family yesterday and given her frail, debilitated state at baseline, family decided that putting her through a septic shock protocol will not be what she would want. We have decided to make her comfort care and start her on a morphine drip.  Length of Stay: 1 day  Current Medications: Scheduled Meds:  . sodium chloride  3 mL Intravenous Q12H    Continuous Infusions: . morphine 1 mg/hr (01/11/15 1100)    PRN Meds: sodium chloride, atropine, LORazepam, sodium chloride  Palliative Performance Scale: 10%     Vital Signs: BP 93/54 mmHg  Pulse 69  Temp(Src) 97.2 F (36.2 C) (Axillary)  Resp 19  Ht _0  (1.575 m)  Wt 36.5 kg (80 lb 7.5 oz)  BMI 14.71 kg/m2  SpO2 100% SpO2: SpO2: 100 % O2 Device: O2 Device: NRB O2 Flow Rate: O2 Flow Rate (L/min): 12 L/min  Intake/output summary:  Intake/Output Summary (Last 24 hours) at 01/11/15 1316 Last data filed at 01/11/15 1100  Gross per 24 hour  Intake   3055 ml  Output    500 ml  Net   2555 ml   LBM:   Baseline Weight: Weight: 54.432 kg (120 lb) Most recent weight: Weight: 36.5 kg (80 lb 7.5 oz)  Additional Data Reviewed: Recent Labs     01/10/15  1052  01/10/15  1100  WBC  26.5*   --   HGB  12.4   12.9  PLT  396   --   NA  157*  158*  BUN  77*  60*  CREATININE  2.35*  2.40*     Problem List:  Patient Active Problem List   Diagnosis Date Noted  . Sepsis ([place]) 01/11/2015  . Palliative care encounter   . DNR (do not resuscitate) discussion   . Hypotension 01/10/2015  . Septic shock ([place]) 01/10/2015  . HCAP (healthcare-associated pneumonia) 01/10/2015  . Dementia 01/10/2015  . JOINT EFFUSION, KNEE 11/02/2007  . OSTEOARTHRITIS, LOWER LEG 12/29/2006  . [place] DISEASE, LUMBOSACRAL SPINE 12/29/2006  . HIGH BLOOD PRESSURE 12/26/2006     Palliative Care Assessment & Plan    Code Status:  DNR  Goals of Care:  FULL COMFORT CARE  Symptom Management:  Morphine infusion  Ativan 1 mg IV Q 2 hours PRN   Prognosis: Hours - Days Discharge Planning: possible hospital death.    Care plan was discussed with Nursing staff, CM, SW and Dr. Marin Comment.   Thank you for allowing the Palliative Medicine Team to assist in the care of this patient.   Time In: 0930 Time Out: 0945 Total Time 15 minutes Prolonged Time Billed  no     Greater than 50%  of this time was spent counseling and  coordinating care related to the above assessment and plan.   Drue Novel, NP  01/11/2015, 1:16 PM  Please contact Palliative Medicine Team phone at [phone] for questions and concerns.

## 2015-01-11 NOTE — Care Management Note (Signed)
Case Management Note  Patient Details  Name: Christina Bartlett MRN: [id number] Date of Birth: 08/05/1930  Subjective/Objective:                  Admitted from [facility]  Action/Plan: Pt has been made comfort care. Palliative consult pending. Pt on morphine gtt. Will cont to follow, possible in-hopsital death anticipated. Will cont to follow.    Expected Discharge Date:  01/14/15               Expected Discharge Plan:  Hospice Medical Facility  In-House Referral:  Clinical Social Work  Discharge planning Services  CM Consult  Post Acute Care Choice:  NA Choice offered to:  NA  DME Arranged:    DME Agency:     HH Arranged:    HH Agency:     Status of Service:  Completed, signed off  Medicare Important Message Given:    Date Medicare IM Given:    Medicare IM give by:    Date Additional Medicare IM Given:    Additional Medicare Important Message give by:     If discussed at Long Length of Stay Meetings, dates discussed:    Additional Comments:  Christina Bartlett, Christina Berman Demske, RN 01/11/2015, 10:44 AM

## 2015-01-11 NOTE — Progress Notes (Signed)
PT TRANSFERRING TO ROOM 304 MED/SURG PALLATIVE CARE. C COLLAR REMAINS IN PLACE. IV SITES X3 PATENT. MORPHINE DRIP IN PROGRESS. FOLEY CATHETER PATENT. REPORT CALLED TO BONNIE RN ON 300.

## 2015-01-11 NOTE — Care Management Note (Deleted)
Case Management Note  Patient Details  Name: Christina Bartlett MRN: [id number] Date of Birth: 10/12/1930  Subjective/Objective:                  Pt is from [facility].   Action/Plan: Anticipate pt will return to [facility] at DC. CSW is aware of DC plan and will work with pt to facilitate return to facility. No CM needs anticipated.   Expected Discharge Date:  01/14/15               Expected Discharge Plan:  Skilled Nursing Facility  In-House Referral:  Clinical Social Work  Discharge planning Services  CM Consult  Post Acute Care Choice:  NA Choice offered to:  NA  DME Arranged:    DME Agency:     HH Arranged:    HH Agency:     Status of Service:  Completed, signed off  Medicare Important Message Given:    Date Medicare IM Given:    Medicare IM give by:    Date Additional Medicare IM Given:    Additional Medicare Important Message give by:     If discussed at Long Length of Stay Meetings, dates discussed:    Additional Comments:  Malcolm MetroChildress, Demarcus Thielke Demske, RN 01/11/2015, 9:42 AM

## 2015-01-11 NOTE — Progress Notes (Signed)
[facility] PROGRESS NOTE  Christina Bartlett ZOX:[id number] DOB: 1930-06-18    PCP:   Pearson Grippe, MD   HPI: Christina Bartlett is an 81 y.o. female admitted for septic shock, likely from HCAP, hx HTN, generalized weakness, now comfort measure only treatment, on IV Morphine drip.   Rewiew of Systems: Unable.   Past Medical History  Diagnosis Date  . Muscle weakness (generalized)   . Hypertension   . Symbolic dysfunction   . Arthropathy   . Lumbago   . Dermatophytosis of groin and perianal area   . Dizziness and giddiness   . Abnormal posture   . Difficulty in walking   . Lack of coordination     History reviewed. No pertinent past surgical history.  Medications:  HOME MEDS: Prior to Admission medications   Medication Sig Start Date End Date Taking? Authorizing Provider  acetaminophen (TYLENOL) 500 MG tablet Take 500 mg by mouth every 6 (six) hours as needed for mild pain or moderate pain.    Historical Provider, MD  aspirin EC 81 MG tablet Take 81 mg by mouth daily.    Historical Provider, MD  Camphor-Menthol-Methyl Sal (SALONPAS) 1.2-5.7-6.3 % PTCH Apply 1 patch topically daily. *Applied daily to back and neck then removed at bedtime    Historical Provider, MD  Cranberry 450 MG CAPS Take 1 capsule by mouth daily.    Historical Provider, MD  DULoxetine (CYMBALTA) 30 MG capsule Take 30 mg by mouth daily.    Historical Provider, MD  lisinopril (PRINIVIL,ZESTRIL) 10 MG tablet Take 20 mg by mouth daily. Takes 20 mg in the morning and 10 mg in the evening    Historical Provider, MD  polyethylene glycol powder (GLYCOLAX/MIRALAX) powder Take 17 g by mouth daily.    Historical Provider, MD  potassium chloride SA (K-DUR,KLOR-CON) 20 MEQ tablet Take 20 mEq by mouth daily.    Historical Provider, MD     Allergies:  Allergies  Allergen Reactions  . Metoclopramide   . Omeprazole   . Pantoprazole   . Penicillins   . Prednisone   . Sulfa Antibiotics     Social History:   reports  that she has quit smoking. She does not have any smokeless tobacco history on file. She reports that she does not drink alcohol or use illicit drugs.  Family History: History reviewed. No pertinent family history.   Physical Exam: Filed Vitals:   01/11/15 0749 01/11/15 0800 01/11/15 0815 01/11/15 0830  BP:   Pulse:  75 76 76  Temp: 97.2 F (36.2 C)     TempSrc:      Resp:  Height:      Weight:      SpO2:  100% 100% 100%   Blood pressure 63/50, pulse 76, temperature 97.2 F (36.2 C), temperature source Axillary, resp. rate 10, height  (1.575 m), weight 36.5 kg (80 lb 7.5 oz), SpO2 100 %.  GEN:  Pleasant  patient lying in the stretcher in no acute distress HEENT: Mucous membranes pink and anicteric no cervical lymphadenopathy nor thyromegaly or carotid bruit; no JVD; There were no stridor. Neck is very supple. Breasts:: Not examined CHEST WALL: No tenderness CHEST: Normal respiration, clear to auscultation bilaterally.  HEART: Regular rate and rhythm.  There are no murmur, rub, or gallops.   BACK: No kyphosis or scoliosis; no CVA tenderness ABDOMEN: soft and non-tender; no masses, no organomegaly, normal abdominal bowel sounds; no pannus; no intertriginous  candida. There is no rebound and no distention. Rectal Exam: Not done EXTREMITIES: No bone or joint deformity; age-appropriate arthropathy of the hands and knees; no edema; no ulcerations.  There is no calf tenderness. Genitalia: not examined PULSES: 2+ and symmetric SKIN: Normal hydration no rash or ulceration CNS: Not responsive.    Labs on Admission:  Basic Metabolic Panel:  Recent Labs Lab 01/10/15 1052 01/10/15 1100  NA 157* 158*  K 4.0 3.9  CL 127* 124*  CO2 20*  --   GLUCOSE 146* 146*  BUN 77* 60*  CREATININE 2.35* 2.40*  CALCIUM 9.7  --    Liver Function Tests:  Recent Labs Lab 01/10/15 1052  AST 18  ALT 9*  ALKPHOS 67  BILITOT 0.8  PROT 5.7*  ALBUMIN 2.7*    CBC:  Recent Labs Lab 01/10/15 1052 01/10/15 1100  WBC 26.5*  --   NEUTROABS 23.4*  --   HGB 12.4 12.9  HCT 39.6 38.0  MCV 94.7  --   PLT 396  --    Cardiac Enzymes:  Recent Labs Lab 01/10/15 1052  TROPONINI 0.07*    CBG: No results for input(s): GLUCAP in the last 168 hours.   Radiological Exams on Admission: Dg Chest 1 View  01/10/2015  CLINICAL DATA:  Patient with decreased level of consciousness. Unable to give history. EXAM: CHEST 1 VIEW COMPARISON:  Chest radiograph 11/02/2014 FINDINGS: Patient is kyphotic and rotated to the left, limiting evaluation. Stable cardiac and mediastinal contours. Interval development of focal consolidative opacity within the left mid lung. Stable biapical pleural parenchymal thickening. No definite pleural effusion or pneumothorax. Kyphoplasty material lower thoracic spine. Suggestion of irregularity of a few of the lower lateral left ribs. IMPRESSION: Small area focal consolidation within the left lower lung may represent an infectious process such as pneumonia. Recommend short term radiographic followup to ensure resolution. Suggestion of irregularity of a few of the left lower lateral ribs which may be positional in etiology. Nondisplaced fractures are not excluded. Recommend correlation for clinical history and point tenderness. Electronically Signed   By: Annia Beltrew  Davis M.D.   On: 01/10/2015 12:26   Ct Head Wo Contrast  01/10/2015  CLINICAL DATA:  Patient unresponsive.  No known traumatic injury. EXAM: CT HEAD WITHOUT CONTRAST TECHNIQUE: Contiguous axial images were obtained from the base of the skull through the vertex without intravenous contrast. COMPARISON:  Brain CT 11/02/2014 FINDINGS: Ventricles and sulci are prominent compatible with atrophy. Extensive periventricular and subcortical white matter hypodensity compatible with chronic small vessel ischemic changes. Old bilateral basal ganglia lacunar infarcts. Old infarct within the right  thalamus. No evidence for acute cortically based infarct, intracranial hemorrhage, mass lesion or mass effect. Orbits unremarkable. Limbic calcifications. Paranasal sinuses are well aerated. Mastoid air cells unremarkable. Calvarium is intact. Mild callus formation about the posterior arch of C1 fracture (image 2; series 3). Incompletely visualized known dens fracture. IMPRESSION: No acute intracranial process. Chronic small vessel ischemic changes. Old C1 and C2 fractures, incompletely visualized. Electronically Signed   By: Annia Beltrew  Davis M.D.   On: 01/10/2015 12:39    Assessment/Plan Present on Admission:  . Hypotension . Septic shock (HCC)   PLAN:   Septic shock:  Continue with CMO, IV morphine.  Add atropine PRN for secretions.  Transfer to floor today.  Spoke with family.    Other plans as per orders.  Code Status: DNR/CMO.    Houston SirenLE,Jeaneane Adamec, MD. [facility] Pager [phone] 7pm to 7am.  01/11/2015, 8:55 AM

## 2015-01-11 NOTE — Clinical Social Work Note (Signed)
Clinical Social Work Assessment  Patient Details  Name: Christina Bartlett MRN: [id number] Date of Birth: 06/15/1930  Date of referral:  01/11/15               Reason for consult:  Facility Placement                Permission sought to share information with:    Permission granted to share information::     Name::        Agency::     Relationship::     Contact Information:     Housing/Transportation Living arrangements for the past 2 months:  Skilled Nursing Facility Source of Information:  Adult Children Patient Interpreter Needed:  None Criminal Activity/Legal Involvement Pertinent to Current Situation/Hospitalization:  No - Comment as needed Significant Relationships:  Adult Children Lives with:  Facility Resident Do you feel safe going back to the place where you live?  No Need for family participation in patient care:  Yes (Comment)  Care giving concerns:  None identified.   Social Worker assessment / plan:  CSW met with patient's son, David Rasnick, who was in the unit family room with his wife and patient's granddaughter.  He advised that patient has been a resident at [facility] for 2.5 years. He stated that patient was mainly bed bound, however they would occasionally get her up in a wheelchair. He stated that patient has been transitioned to comfort care and they are expecting an in-hospital death.  CSW provided supportive counseling to the family.    Employment status:  Retired Insurance information:  Medicare PT Recommendations:  Not assessed at this time Information / Referral to community resources:     Patient/Family's Response to care: Patient's family is expecting an in-hospital death.  Patient/Family's Understanding of and Emotional Response to Diagnosis, Current Treatment, and Prognosis:  Patient's family understands patient diagnosis, treatment and prognosis and are accepting of an in-hospital death.    Emotional Assessment Appearance:  Appears stated  age Attitude/Demeanor/Rapport:  Unable to Assess Affect (typically observed):  Unable to Assess Orientation:    Alcohol / Substance use:  Not Applicable Psych involvement (Current and /or in the community):  No (Comment)  Discharge Needs  Concerns to be addressed:  No discharge needs identified Readmission within the last 30 days:  No Current discharge risk:  None Barriers to Discharge:  No Barriers Identified   ,  D, LCSW 01/11/2015, 11:05 AM [phone] 

## 2015-01-12 DIAGNOSIS — Z515 Encounter for palliative care: Secondary | ICD-10-CM

## 2015-01-12 DIAGNOSIS — Z7189 Other specified counseling: Secondary | ICD-10-CM

## 2015-01-12 LAB — GI PATHOGEN PANEL BY PCR, STOOL
C DIFFICILE TOXIN A/B: NOT DETECTED
CAMPYLOBACTER BY PCR: NOT DETECTED
CRYPTOSPORIDIUM BY PCR: NOT DETECTED
E COLI (STEC): NOT DETECTED
E coli (ETEC) LT/ST: NOT DETECTED
E coli 0157 by PCR: NOT DETECTED
G LAMBLIA BY PCR: NOT DETECTED
Norovirus GI/GII: NOT DETECTED
ROTAVIRUS A BY PCR: NOT DETECTED
Salmonella by PCR: NOT DETECTED
Shigella by PCR: NOT DETECTED

## 2015-01-12 MED ORDER — MORPHINE SULFATE (CONCENTRATE) 10 MG /0.5 ML PO SOLN: 2.5000 mg | ORAL | Status: DC

## 2015-01-12 MED ORDER — MORPHINE SULFATE (CONCENTRATE) 10 MG/0.5ML PO SOLN: 2.5000 mg | ORAL | Status: DC | PRN

## 2015-01-12 MED ORDER — MORPHINE SULFATE 10 MG/5ML PO SOLN: 2.5000 mg | ORAL | Status: DC | PRN

## 2015-01-12 MED ORDER — MORPHINE SULFATE 10 MG/5ML PO SOLN: 2.5000 mg | ORAL | Status: AC | PRN

## 2015-01-12 MED ORDER — MORPHINE SULFATE 10 MG/5ML PO SOLN: 2.5000 mg | ORAL | Status: AC

## 2015-01-12 NOTE — Progress Notes (Addendum)
NURSING PROGRESS NOTE  Christina CollinBetty J Bartlett [id number] Discharge Data: 01/12/2015 2:11 PM Attending Provider: No att. providers found PCP:KIM, Fayrene FearingJAMES, MD   Christina Bartlett to be D/C'd Skilled nursing facility per MD order.  IVs remain for continued care at hospice facility.  All belongings returned to patient for patient to take home.  AVS reviewed with patient.  Patient left floor via stretcher, escorted by EMS.  Report called to hospice home, all questions answered.  Last Documented Vital Signs:  Blood pressure 78/48, pulse 91, temperature 97.6 F (36.4 C), temperature source Axillary, resp. rate 7, height 5\' 2"  (1.575 m), weight 36.5 kg (80 lb 7.5 oz), SpO2 97 %.  Mertha BaarsHorine, Malissa Slay D

## 2015-01-12 NOTE — Care Management Important Message (Signed)
Important Message  Patient Details  Name: Christina Bartlett MRN: [id number] Date of Birth: 06/21/1930   Medicare Important Message Given:  N/A - LOS <3 / Initial given by admissions    Malcolm Metrohildress, Danira Nylander Demske, RN 01/12/2015, 12:01 PM

## 2015-01-12 NOTE — Care Management Note (Signed)
Case Management Note  Patient Details  Name: Christina Bartlett MRN: [id number] Date of Birth: 11/06/1930  Subjective/Objective:                    Action/Plan: [facility] pt family choice. CSW has arranged for placement at hospice. No CM needs noted.   Expected Discharge Date:  01/14/15               Expected Discharge Plan:  Hospice Medical Facility  In-House Referral:  Clinical Social Work  Discharge planning Services  CM Consult  Post Acute Care Choice:  NA Choice offered to:  NA  DME Arranged:    DME Agency:     HH Arranged:    HH Agency:     Status of Service:  Completed, signed off  Medicare Important Message Given:  N/A - LOS <3 / Initial given by admissions Date Medicare IM Given:    Medicare IM give by:    Date Additional Medicare IM Given:    Additional Medicare Important Message give by:     If discussed at Long Length of Stay Meetings, dates discussed:    Additional Comments:  Malcolm MetroChildress, Ilisha Blust Demske, RN 01/12/2015, 12:01 PM

## 2015-01-12 NOTE — Discharge Summary (Signed)
Physician Discharge Summary  Christina Bartlett:[id number] DOB: [birth year] DOA: 01/10/2015  PCP: Pearson Grippe, MD  Admit date: 01/10/2015 Discharge date: 01/12/2015  Time spent: 35 minutes  Recommendations for Outpatient Follow-up:  1. To Hospice House.   Discharge Diagnoses:  Principal Problem:   Septic shock (HCC) Active Problems:   Hypotension   HCAP (healthcare-associated pneumonia)   Dementia   Sepsis [facility])   Palliative care encounter   DNR (do not resuscitate) discussion   Discharge Condition:  Critically ill.  For Hospice Care only.   Diet recommendation: NPO.   Filed Weights   01/10/15 1300 01/10/15 1439 01/11/15 0400  Weight: 54.432 kg (120 lb) 36.5 kg (80 lb 7.5 oz) 36.5 kg (80 lb 7.5 oz)    History of present illness: patient was admitted from SNF by Dr Ardyth Harps on Oct 25th, 2016 for unresponsiveness.  As per her H and P:  " Patient is a 81 year old woman from a skilled nursing facility who presents today after being found unresponsive. I am unable to obtain any history from the patient. ED records state that she was found unresponsive in her bed at the [facility] nursing home about 30 minutes prior to arrival. She does have a history of neck fractures and it is unknown if she fell or had an injury. Her blood pressure was found to be 198. EMS was unable to get a blood pressure. In the ED her blood pressure was 50/30. It has not drastically improved despite fluids coming up to about 70 or 80 systolic. She remains pretty much unresponsive even to sternal rub with occasional moaning, groaning and facial grimacing. Workup in the emergency department shows her to be hypernatremic with a sodium of 158, acute renal failure with a creatinine of 2.4, elevated lactic acid of 3.33, leukocytosis of 26.5. Chest x-ray shows small area of focal consolidation within the left lower lung. I have discussed case with son and daughter-in-law at bedside. Given her frail, debilitated state at baseline  we have decided that putting her through a septic shock protocol will be what she would want. We have decided to make her comfort care and start her on a morphine drip.   Hospital Course: Patient was admitted into the ICU, and she was given low dose morphine drip.  No further diagnostic tests or labs were ordered.   She appears comfortable, and the following day, she was transferred to floor.  Her only son and his family have been at her bedside.  She is now transferred to the Hospice house and to have continued comfort measure only care.  Her death is imminent.  Thank you for allowing me to participate in the care of your patient.    Discharge Exam: Filed Vitals:   01/12/15 0652  BP: 78/48  Pulse: 91  Temp: 97.6 F (36.4 C)  Resp: 7    Discharge Instructions   Discharge Instructions    Diet - low sodium heart healthy    Complete by:  As directed      Discharge instructions    Complete by:  As directed   Patient is to be discharged to Hospice as planned.  Hospice to use their standing orders for pain and symptom management.     Increase activity slowly    Complete by:  As directed           Current Discharge Medication List    START taking these medications   Details   morphine 10 MG/5ML solution Take 1.3  mLs (2.6 mg total) by mouth every hour as needed for severe pain. Qty: 30 mL, Refills: 0     morphine 10 MG/5ML solution Take 1.3 mLs (2.6 mg total) by mouth every 4 (four) hours. Qty: 20 mL, Refills: 0        STOP taking these medications     acetaminophen (TYLENOL) 500 MG tablet      aspirin EC 81 MG tablet      cholecalciferol (VITAMIN D) 1000 UNITS tablet      Cranberry 450 MG CAPS      DULoxetine (CYMBALTA) 30 MG capsule      fentaNYL (DURAGESIC - DOSED MCG/HR) 12 MCG/HR      HYDROcodone-acetaminophen (NORCO/VICODIN) 5-325 MG tablet      lisinopril (PRINIVIL,ZESTRIL) 10 MG tablet      Nutritional Supplements (RESOURCE 2.0) LIQD      polyethylene  glycol powder (GLYCOLAX/MIRALAX) powder      potassium chloride SA (K-DUR,KLOR-CON) 20 MEQ tablet      senna-docusate (SENNA PLUS) 8.6-50 MG tablet        Allergies  Allergen Reactions  . Metoclopramide   . Omeprazole   . Pantoprazole   . Penicillins   . Prednisone   . Sulfa Antibiotics       The results of significant diagnostics from this hospitalization (including imaging, microbiology, ancillary and laboratory) are listed below for reference.    Significant Diagnostic Studies: Dg Chest 1 View  01/10/2015  CLINICAL DATA:  Patient with decreased level of consciousness. Unable to give history. EXAM: CHEST 1 VIEW COMPARISON:  Chest radiograph 11/02/2014 FINDINGS: Patient is kyphotic and rotated to the left, limiting evaluation. Stable cardiac and mediastinal contours. Interval development of focal consolidative opacity within the left mid lung. Stable biapical pleural parenchymal thickening. No definite pleural effusion or pneumothorax. Kyphoplasty material lower thoracic spine. Suggestion of irregularity of a few of the lower lateral left ribs. IMPRESSION: Small area focal consolidation within the left lower lung may represent an infectious process such as pneumonia. Recommend short term radiographic followup to ensure resolution. Suggestion of irregularity of a few of the left lower lateral ribs which may be positional in etiology. Nondisplaced fractures are not excluded. Recommend correlation for clinical history and point tenderness. Electronically Signed   By: Annia Belt M.D.   On: 01/10/2015 12:26   Ct Head Wo Contrast  01/10/2015  CLINICAL DATA:  Patient unresponsive.  No known traumatic injury. EXAM: CT HEAD WITHOUT CONTRAST TECHNIQUE: Contiguous axial images were obtained from the base of the skull through the vertex without intravenous contrast. COMPARISON:  Brain CT 11/02/2014 FINDINGS: Ventricles and sulci are prominent compatible with atrophy. Extensive periventricular and  subcortical white matter hypodensity compatible with chronic small vessel ischemic changes. Old bilateral basal ganglia lacunar infarcts. Old infarct within the right thalamus. No evidence for acute cortically based infarct, intracranial hemorrhage, mass lesion or mass effect. Orbits unremarkable. Limbic calcifications. Paranasal sinuses are well aerated. Mastoid air cells unremarkable. Calvarium is intact. Mild callus formation about the posterior arch of C1 fracture (image 2; series 3). Incompletely visualized known dens fracture. IMPRESSION: No acute intracranial process. Chronic small vessel ischemic changes. Old C1 and C2 fractures, incompletely visualized. Electronically Signed   By: Annia Belt M.D.   On: 01/10/2015 12:39    Microbiology: Recent Results (from the past 240 hour(s))  C difficile quick scan w PCR reflex     Status: None   Collection Time: 01/10/15 11:11 AM  Result Value Ref Range  Status   C Diff antigen NEGATIVE NEGATIVE Final   C Diff toxin NEGATIVE NEGATIVE Final   C Diff interpretation Negative for toxigenic C. difficile  Final  Culture, blood (routine x 2)     Status: None (Preliminary result)   Collection Time: 01/10/15 12:38 PM  Result Value Ref Range Status   Specimen Description BLOOD LEFT ARM  Final   Special Requests BOTTLES DRAWN AEROBIC ONLY 4CC  Final   Culture NO GROWTH 2 DAYS  Final   Report Status PENDING  Incomplete  MRSA PCR Screening     Status: None   Collection Time: 01/10/15  3:20 PM  Result Value Ref Range Status   MRSA by PCR NEGATIVE NEGATIVE Final    Comment:        The GeneXpert MRSA Assay (FDA approved for NASAL specimens only), is one component of a comprehensive MRSA colonization surveillance program. It is not intended to diagnose MRSA infection nor to guide or monitor treatment for MRSA infections.      Labs: Basic Metabolic Panel:  Recent Labs Lab 01/10/15 1052 01/10/15 1100  NA 157* 158*  K 4.0 3.9  CL 127* 124*  CO2  20*  --   GLUCOSE 146* 146*  BUN 77* 60*  CREATININE 2.35* 2.40*  CALCIUM 9.7  --    Liver Function Tests:  Recent Labs Lab 01/10/15 1052  AST 18  ALT 9*  ALKPHOS 67  BILITOT 0.8  PROT 5.7*  ALBUMIN 2.7*   CBC:  Recent Labs Lab 01/10/15 1052 01/10/15 1100  WBC 26.5*  --   NEUTROABS 23.4*  --   HGB 12.4 12.9  HCT 39.6 38.0  MCV 94.7  --   PLT 396  --    Cardiac Enzymes:  Recent Labs Lab 01/10/15 1052  TROPONINI 0.07*   Signed:  Chayla Shands  [facility] 01/12/2015, 11:50 AM

## 2015-01-12 NOTE — Progress Notes (Signed)
Daily Progress Note   Patient Name: Christina Bartlett       Date: 01/12/2015 DOB: 20-Jun-1930  Age: 81 y.o. MRN#: [id number] Attending Physician: Houston Siren, MD Primary Care Physician: Pearson Grippe, MD Admit Date: 01/10/2015  Reason for Consultation/Follow-up: Establishing goals of care and Psychosocial/spiritual support  Subjective: Christina Bartlett seems to be resting comfortably, she will open her eyes but not communicate.  Call to son and DIL left messages.  Notified by nursing that Onalee Hua and Elnita Maxwell have arrived 10-15 minutes later.  We meet and discuss their goals.  Onalee Hua tells me that he feels his mother has had more rest here than she had in the last 2 years in the nursing home.  He tells me that he doesn't feel he is able to be at Christina Bartlett's bedside for her passing.  I share that everyone is different and he must do what he feels is right for him, that we will not judge his needs at this time.  I tell them about the services at [facility] home in [place].  Christina Bartlett agree that Hospice home would be good for her final days.  Call to Reginal Lutes with Hospice, Onalee Hua and Elnita Maxwell will go there to complete paperwork.     Length of Stay: 2 days  Current Medications: Scheduled Meds:  . sodium chloride  3 mL Intravenous Q12H    Continuous Infusions: . morphine 1 mg/hr (01/11/15 1300)    PRN Meds: sodium chloride, atropine, LORazepam, sodium chloride  Palliative Performance Scale: 10%     Vital Signs: BP 78/48 mmHg  Pulse 91  Temp(Src) 97.6 F (36.4 C) (Axillary)  Resp 7  Ht  (1.575 m)  Wt 36.5 kg (80 lb 7.5 oz)  BMI 14.71 kg/m2  SpO2 97% SpO2: SpO2: 97 % O2 Device: O2 Device: Nasal Cannula O2 Flow Rate: O2 Flow Rate (L/min): 12 L/min  Intake/output summary:  Intake/Output Summary (Last 24 hours) at 01/12/15 1009 Last data filed at 01/12/15 0645  Gross per 24 hour  Intake     33 ml  Output    650 ml  Net   -617 ml   LBM:   Baseline Weight: Weight: 54.432 kg  (120 lb) Most recent weight: Weight: 36.5 kg (80 lb 7.5 oz)  Physical Exam: Constitutional:  Elderly, frail, opens eyes but no communication Resp: even, shallow Cardio: no edema              Additional Data Reviewed: Recent Labs     01/10/15  1052  01/10/15  1100  WBC  26.5*   --   HGB  12.4  12.9  PLT  396   --   NA  157*  158*  BUN  77*  60*  CREATININE  2.35*  2.40*     Problem List:  Patient Active Problem List   Diagnosis Date Noted  . Sepsis (HCC) 01/11/2015  . Palliative care encounter   . DNR (do not resuscitate) discussion   . Hypotension 01/10/2015  . Septic shock (HCC) 01/10/2015  . HCAP (healthcare-associated pneumonia) 01/10/2015  . Dementia 01/10/2015  . JOINT EFFUSION, KNEE 11/02/2007  . OSTEOARTHRITIS, LOWER LEG 12/29/2006  . DISC DISEASE, LUMBOSACRAL SPINE 12/29/2006  . HIGH BLOOD PRESSURE 12/26/2006     Palliative Care Assessment & Plan    Code Status:  DNR  Goals of Care:  Transition to Hospice home in [place].   Symptom Management:  Morphine 2.5 mg SL Q 4 hours sched,  and Q 1 hour PRN  Palliative Prophylaxis:  None at this time  Psycho-social/Spiritual:  Desire for further Chaplaincy support:yes   Prognosis: Hours - Days Discharge Planning: Hospice facility   Care plan was discussed with nursing staff, CM, SW and Dr. Conley RollsLe.   Thank you for allowing the Palliative Medicine Team to assist in the care of this patient.   Time In:  0930 Time Out: 1000 Total Time 30 minutes Prolonged Time Billed  no     Greater than 50%  of this time was spent counseling and coordinating care related to the above assessment and plan.   Katheran Aweasha A Othman Masur, NP  01/12/2015, 10:09 AM  Please contact Palliative Medicine Team phone at [phone] for questions and concerns.

## 2015-01-12 NOTE — Progress Notes (Signed)
Nutrition Brief Note  Chart reviewed. Pt has transitioned  to comfort care.  No addtional nutrition interventions warranted at this time.    Royann ShiversLynn Kenneshia Rehm MS,RD,CSG,LDN Office: [phone] Pager: [phone]

## 2015-01-15 LAB — CULTURE, BLOOD (ROUTINE X 2): Culture: NO GROWTH

## 2015-01-17 DEATH — deceased

## 2016-09-26 IMAGING — DX DG LUMBAR SPINE COMPLETE 4+V
5 series · 5 of 5 positions shown · non-contrast
Comparison: CT scan of the chest dated 10/21/2012

CLINICAL DATA: Trauma after falling out of her wheelchair.

EXAM:
LUMBAR SPINE - COMPLETE 4+ VIEW

[l-spine ap]
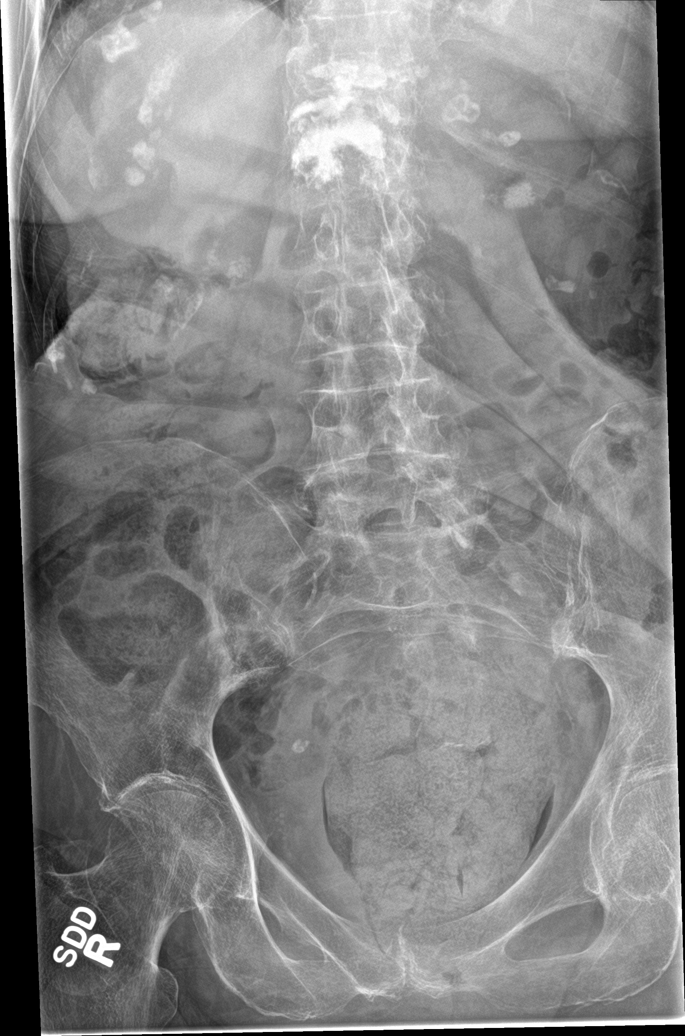

[l-spine obl (1 of 2)]
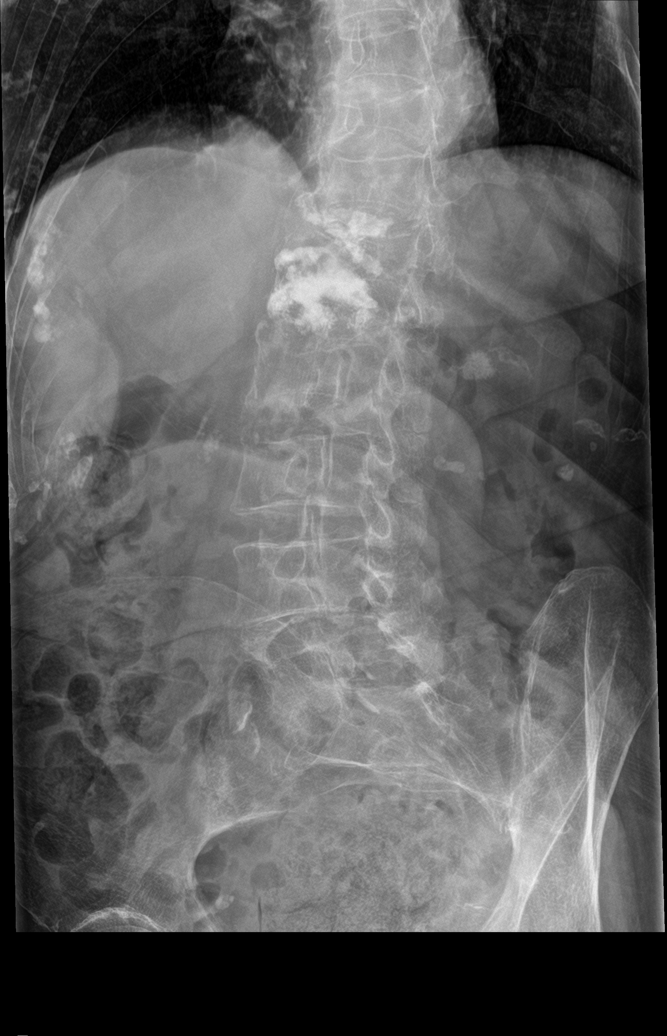

[l-spine obl (2 of 2)]
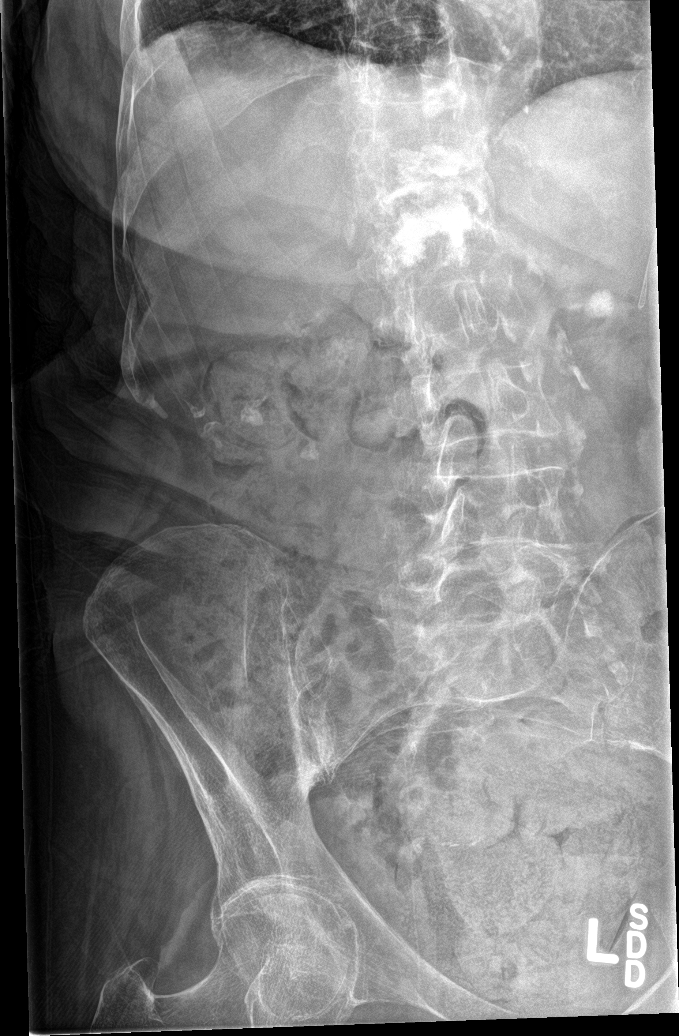

[l-spine spot]
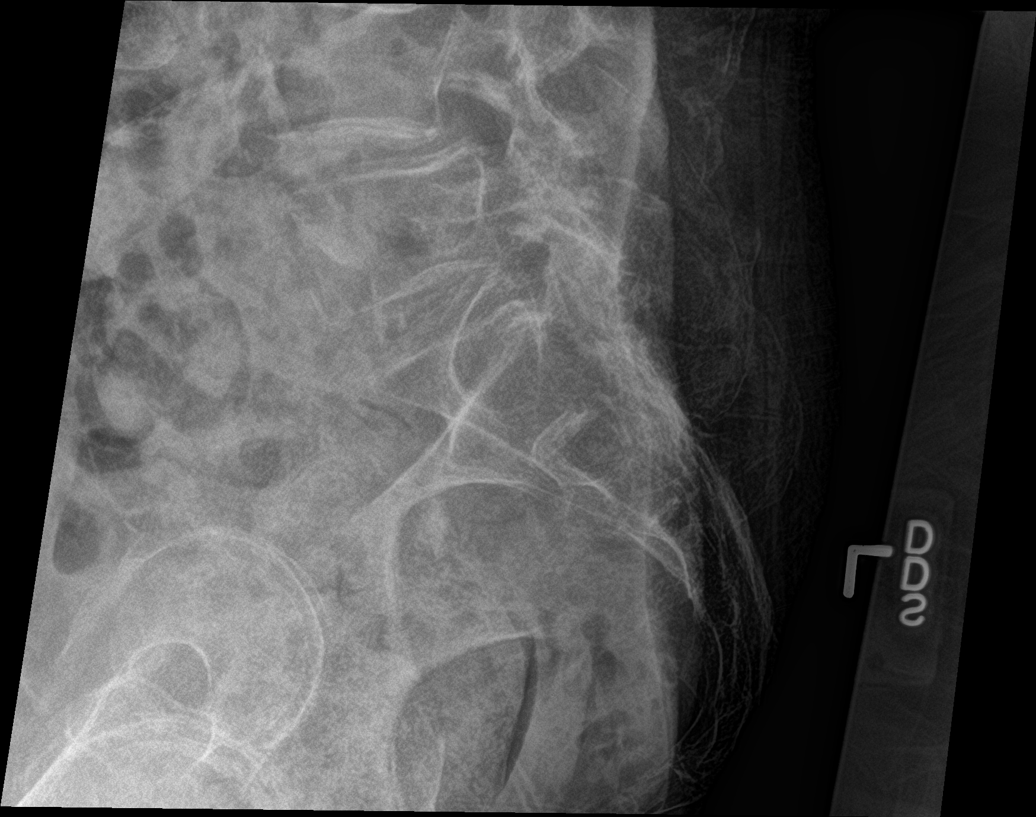

[l-spine lat]
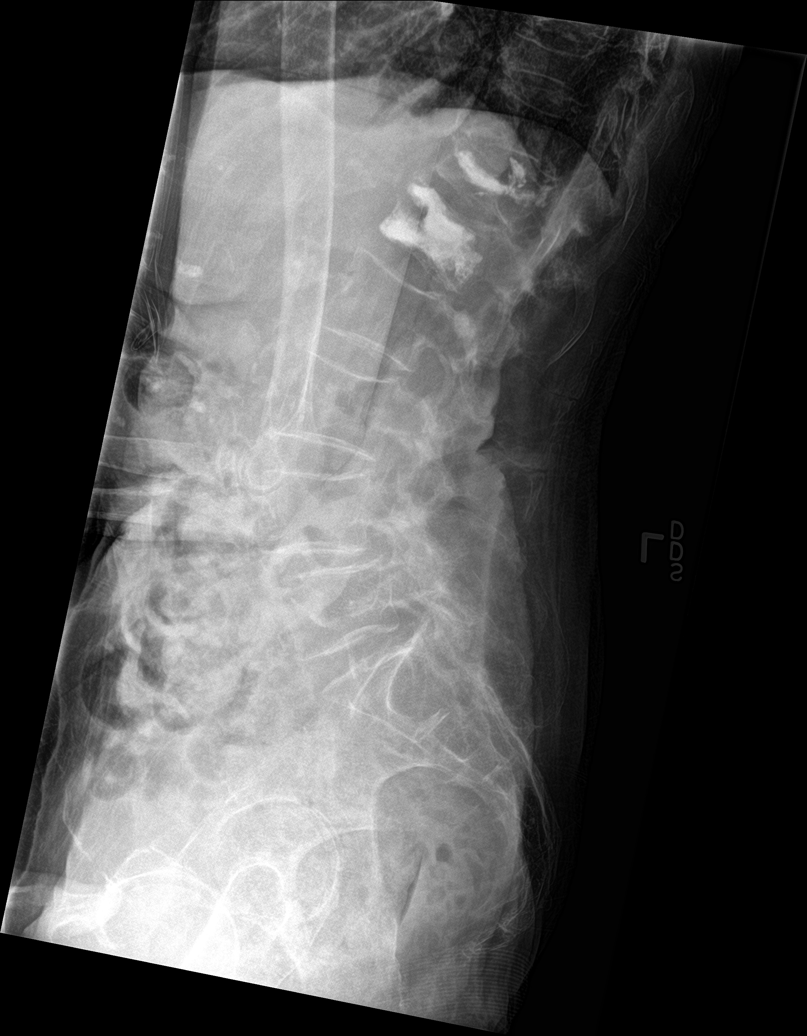

[5 of 5 positions shown; findings below may reference images not displayed]

FINDINGS: There are old compression fractures of T10, T12, and L1. Grade 1
spondylolisthesis of L4 on L5. There is no acute fracture. No disc
space narrowing. Slight bilateral facet arthritis at L4-5 and L5-S1.

Large amount of stool in the rectum.
IMPRESSION: No acute abnormalities of the lumbar spine.

## 2020-05-16 DEATH — deceased
